# Patient Record
Sex: Female | Born: 1970 | Race: White | Hispanic: No | Marital: Married | Smoking: Never smoker
Health system: Southern US, Community
[De-identification: ages and names within clinical notes are randomized; demographics above are authoritative.]

## PROBLEM LIST (undated history)

## (undated) DIAGNOSIS — N200 Calculus of kidney: Secondary | ICD-10-CM

## (undated) DIAGNOSIS — E039 Hypothyroidism, unspecified: Secondary | ICD-10-CM

## (undated) DIAGNOSIS — K429 Umbilical hernia without obstruction or gangrene: Secondary | ICD-10-CM

## (undated) DIAGNOSIS — Z8639 Personal history of other endocrine, nutritional and metabolic disease: Secondary | ICD-10-CM

## (undated) HISTORY — DX: Calculus of kidney: N20.0

---

## 2004-09-20 ENCOUNTER — Ambulatory Visit: Payer: Self-pay

## 2004-10-08 ENCOUNTER — Ambulatory Visit: Payer: Self-pay | Admitting: Internal Medicine

## 2005-05-02 ENCOUNTER — Other Ambulatory Visit: Admission: RE | Admit: 2005-05-02 | Discharge: 2005-05-02 | Payer: Self-pay | Admitting: Obstetrics and Gynecology

## 2006-02-08 ENCOUNTER — Inpatient Hospital Stay (HOSPITAL_COMMUNITY): Admission: AD | Admit: 2006-02-08 | Discharge: 2006-02-11 | Payer: Self-pay | Admitting: Pediatrics

## 2006-02-28 ENCOUNTER — Emergency Department (HOSPITAL_COMMUNITY): Admission: EM | Admit: 2006-02-28 | Discharge: 2006-02-28 | Payer: Self-pay | Admitting: Family Medicine

## 2006-08-02 ENCOUNTER — Ambulatory Visit: Payer: Self-pay | Admitting: Emergency Medicine

## 2007-02-18 ENCOUNTER — Ambulatory Visit: Payer: Self-pay | Admitting: Internal Medicine

## 2007-08-26 ENCOUNTER — Encounter: Admission: RE | Admit: 2007-08-26 | Discharge: 2007-08-26 | Payer: Self-pay | Admitting: Obstetrics and Gynecology

## 2007-12-03 ENCOUNTER — Ambulatory Visit (HOSPITAL_COMMUNITY): Admission: RE | Admit: 2007-12-03 | Discharge: 2007-12-03 | Payer: Self-pay | Admitting: Obstetrics and Gynecology

## 2008-02-10 HISTORY — PX: NASAL TURBINATE REDUCTION: SHX2072

## 2008-02-11 ENCOUNTER — Encounter (INDEPENDENT_AMBULATORY_CARE_PROVIDER_SITE_OTHER): Payer: Self-pay | Admitting: Otolaryngology

## 2008-02-11 ENCOUNTER — Ambulatory Visit (HOSPITAL_BASED_OUTPATIENT_CLINIC_OR_DEPARTMENT_OTHER): Admission: RE | Admit: 2008-02-11 | Discharge: 2008-02-11 | Payer: Self-pay | Admitting: Otolaryngology

## 2008-02-11 HISTORY — PX: SEPTOPLASTY WITH ETHMOIDECTOMY, AND MAXILLARY ANTROSTOMY: SHX6090

## 2008-03-10 ENCOUNTER — Ambulatory Visit: Payer: Self-pay | Admitting: Family Medicine

## 2008-03-21 ENCOUNTER — Ambulatory Visit (HOSPITAL_COMMUNITY): Admission: RE | Admit: 2008-03-21 | Discharge: 2008-03-21 | Payer: Self-pay | Admitting: Obstetrics and Gynecology

## 2008-03-21 HISTORY — PX: ENDOMETRIAL ABLATION: SHX621

## 2008-03-21 HISTORY — PX: CHROMOPERTUBATION: SHX6288

## 2008-12-06 ENCOUNTER — Inpatient Hospital Stay (HOSPITAL_COMMUNITY): Admission: AD | Admit: 2008-12-06 | Discharge: 2008-12-06 | Payer: Self-pay | Admitting: Obstetrics and Gynecology

## 2008-12-19 ENCOUNTER — Inpatient Hospital Stay (HOSPITAL_COMMUNITY): Admission: RE | Admit: 2008-12-19 | Discharge: 2008-12-21 | Payer: Self-pay | Admitting: Obstetrics and Gynecology

## 2009-01-03 ENCOUNTER — Ambulatory Visit: Admission: RE | Admit: 2009-01-03 | Discharge: 2009-01-03 | Payer: Self-pay | Admitting: Obstetrics and Gynecology

## 2010-04-01 ENCOUNTER — Encounter: Payer: Self-pay | Admitting: Obstetrics and Gynecology

## 2010-06-14 LAB — CBC
Hemoglobin: 9.3 g/dL — ABNORMAL LOW (ref 12.0–15.0)
Platelets: 235 10*3/uL (ref 150–400)
RBC: 3.28 MIL/uL — ABNORMAL LOW (ref 3.87–5.11)
RBC: 3.79 MIL/uL — ABNORMAL LOW (ref 3.87–5.11)
WBC: 12.9 10*3/uL — ABNORMAL HIGH (ref 4.0–10.5)

## 2010-06-14 LAB — RPR: RPR Ser Ql: NONREACTIVE

## 2010-06-25 LAB — PREGNANCY, URINE: Preg Test, Ur: NEGATIVE

## 2010-06-25 LAB — CBC
MCV: 89.3 fL (ref 78.0–100.0)
Platelets: 227 10*3/uL (ref 150–400)
WBC: 9 10*3/uL (ref 4.0–10.5)

## 2010-07-24 NOTE — Op Note (Signed)
NAMEKIRSTEIN, [name]                 ACCOUNT NO.:  [id number]   MEDICAL RECORD NO.:  [id number]          PATIENT TYPE:  AMB   LOCATION:  SDC                           FACILITY:  [facility]   PHYSICIAN:  Dineen Kid. Rana Snare, M.D.    DATE OF BIRTH:  10/21/1970   DATE OF PROCEDURE:  DATE OF DISCHARGE:                               OPERATIVE REPORT   PREOPERATIVE DIAGNOSES:  Pelvic pain and family history of  endometriosis.   POSTOPERATIVE DIAGNOSES:  Pelvic pain and family history of  endometriosis plus mild endometriosis.   PROCEDURE:  Laparoscopy with ablation of endometriosis implants and  chromopertubation.   SURGEON:  Dineen Kid. Rana Snare, MD   ANESTHESIA:  General endotracheal.   INDICATIONS:  Alicia Frazier is a 40 year old, G1, P1, with worsening  problems with pelvic pain.  She is also currently trying to conceive.  She had no problems getting pregnant with her first child.  Does have a  strong family history of endometriosis and wished to proceed for  laparoscopic evaluation of this.  Risks and benefits of the procedure  were discussed at length which include, but not limited to risk of  infection, bleeding, damage to the uterus, tubes, ovary, bowel, bladder,  ureters.  She understands that may not be able to alleviate the pain.  It could recur or could worsen.  She does give her informed consent and  wished to proceed.   FINDINGS AT THE TIME OF SURGERY:  Normal-appearing liver, gallbladder,  appendix, uterus, ovaries, bilateral tubes showed good spill with the  chromopertubation.  She does have several small endometriosis implants  and bilateral uterosacral ligaments and also small 2-3 mm implant at the  base of the cul-de-sac.  Otherwise, normal-appearing ovarian fossa and  ovaries.   DESCRIPTION OF PROCEDURE:  After adequate analgesia, the patient was  placed in the dorsal lithotomy position.  She was sterilely prepped and  draped.  The bladder was sterilely drained.  Graves speculum was  placed.  Tenaculum placed in the edge of the cervix.  A Cohen tenaculum was then  placed.  A 1-cm infraumbilical skin incision was made.  A Veress needle  was inserted.  The abdomen was insufflated with dullness to percussion.  An 11-mm trocar was inserted.  The above findings were noticed.  A 5-mm  trocar was inserted left to the midline 2 fingerbreadths above the pubic  symphysis under direct visualization.  After careful systematic  evaluation of the abdomen and pelvis, the endometriosis implants were  carefully identified.  Bipolar cautery was used to cauterize the  implants at the level of the uterosacral ligaments.  Care taken to avoid  the underlying ureter and achieve good thermal cautery effect at the  peritoneal implants and achieve good hemostasis.  After this was  achieved and no evidence of endometriosis implants were remaining and  good hemostasis had been achieved, the cul-de-sac and the implants at  the base of the cul-de-sac were also ablated in a similar fashion.  Again, careful systematic evaluation of the pelvis, no further implants  were noted.  Chromopertubation was  carried out with good flow noted and  good spill of blue through the fimbriated end.  The abdomen was then  desufflated.  The 5-mm trocar was removed.  The peritoneal hole from the  5-mm site was closed using bipolar cautery, cauterizing the small  peritoneal window.  The 11-mm trocar was then removed, and the abdomen  desufflated.  The 11-mm trocar site was closed with a 0 Vicryl  interrupted suture in the fascia, 3-0 Vicryl Rapide subcuticular suture.  The 5-mm site was closed with a 3-0 Vicryl Rapide subcuticular suture,  and Dermabond.  The incisions were injected with 0.25% Marcaine, total  of 10 mL used.  The tenaculum removed from the cervix and noted to be  hemostatic.  The patient was then transferred to the recovery room in  stable condition.  Sponge and instrument count was normal x3.   Estimated  blood loss was minimal.  The patient received 1 g of cefotetan  preoperatively and 30 mg of Toradol postoperatively.   DISPOSITION:  The patient will be discharged to home and will follow up  in the office in 2-3 weeks.  Given the routine instruction sheet for  laparoscopy.  Told to return for increased pain, fever, or bleeding.  She is sent home with a prescription for Vicodin #30 to take as needed  for pain.  She is also going to continue with Femara at 5 mg days 3  through 7 at timed intercourse.      Dineen Kid Rana Snare, M.D.  Electronically Signed     DCL/MEDQ  D:  03/21/2008  T:  03/21/2008  Job:  [id number]

## 2010-07-24 NOTE — Op Note (Signed)
NAMESHANTALE, HOLTMEYER                 ACCOUNT NO.:  [id number]   MEDICAL RECORD NO.:  [id number]          PATIENT TYPE:  AMB   LOCATION:  DSC                          FACILITY:  [facility]   PHYSICIAN:  Kinnie Scales. Annalee Genta, M.D.DATE OF BIRTH:  Jul 06, 1970   DATE OF PROCEDURE:  02/11/2008  DATE OF DISCHARGE:                               OPERATIVE REPORT   PREOPERATIVE DIAGNOSES:  1. Recurrent sinusitis.  2. Nasal septal deviation.  3. Inferior turbinate hypertrophy.   POSTOPERATIVE DIAGNOSES:  1. Recurrent sinusitis.  2. Nasal septal deviation.  3. Inferior turbinate hypertrophy.   INDICATIONS FOR SURGERY:  1. Recurrent sinusitis.  2. Nasal septal deviation.  3. Inferior turbinate hypertrophy.   SURGICAL PROCEDURE:  1. Bilateral endoscopic sinus surgery consisting of anterior      ethmoidectomy, maxillary antrostomy, nasofrontal recess exploration      and concha bullosa resection.  2. Nasal septoplasty.  3. Bilateral inferior turbinate reduction.   SURGEON:  Kinnie Scales. Annalee Genta, MD   ANESTHESIA:  General endotracheal.   COMPLICATIONS:  None.   BLOOD LOSS:  Less than 150 mL.   The patient transferred from the operating room to the recovery room in  stable condition.   BRIEF HISTORY:  The patient is a 40 year old white female with a history  of recurrent sinusitis and nasal airway obstruction.  The patient has  been treated for multiple episodes of sinusitis requiring antibiotic  therapy with approximately four to five episodes of infection per year.  This has been a progressive situation with worsening infections and  despite appropriate medical therapy, the patient continues to have  chronic nasal airway obstruction, frequent periorbital headache, and  mucopurulent nasal discharge.  CT scanning after antibiotic therapy  showed a severely deviated septum, bilateral turbinate hypertrophy,  bilateral large concha bullosa with obstruction of the ostiomeatal  complex and  mucosal thickening of the [facility] and ethmoid sinuses  bilaterally.  Given the patient's history, examination, and CT findings,  I recommended that we consider her for the above surgical procedures.  The patient had been appropriately treated with medical therapy and  failed to adequately resolve her ongoing symptoms.  Given her history  and examination, she understood and concurred with our plan for surgery.  The risks, benefits, and possible complications were discussed in detail  with the patient and her husband and surgery was scheduled as an  outpatient under general anesthesia at [facility].   PROCEDURE:  The patient was brought to the operating room on February 11, 2008, and placed in supine position on the operating table.  General  endotracheal anesthesia was established without difficulty.  When the  patient was adequately anesthetized, her nose was injected with a total  of 6 mL of 1% lidocaine with 1:100,000 solution of epinephrine injected  in the submucosal fashion along the nasal septum, lateral nasal wall,  middle turbinate, and inferior turbinates bilaterally.  The patient's  nose was then packed with Afrin soaked cottonoid pledgets, which was  left in place for approximately 10 minutes.  After allowing adequate  time for vasoconstriction hemostasis,  the patient was prepped and draped  in a sterile fashion, positioned on the operating table and the surgical  procedure was begun.   Surgery was begun with a left endoscopic sinus surgery.  Using a 0  degree telescope, nasal cavity was examined.  The patient had a very  large middle turbinate concha bullosa.  A vertically oriented incision  was created in the anterior face of the middle turbinate.  This was  carried from anterior to posterior and curved endoscopic scissors were  used to resect the lateral aspect of the concha bullosa preserving the  medial component of the middle turbinate.  The uncinate process was  then  reflected anteriorly and through-cut from inferior to superior.  Anterior ethmoid bulla was identified and using a 0 degree telescope and  a straight microdebrider, it was resected from inferior to superior.  No  further ethmoid dissection was undertaken.  Attention was then turned to  lateral nasal wall with a 45-degree telescope.  The natural ostia of the  maxillary sinus was identified.  It was completely occluded by the  uncinate process and was resected with through-cutting forceps creating  a widely patent maxillary sinus.  Attention was then turned to the  anterior superior ethmoid region, nasofrontal recess was obstructed by  underlying air cells and these were resected with a through-cutting  forceps and a microdebrider to create a widely patent nasofrontal  recess.   Attention was then turned to the nasal septum.  A left anterior  hemitransfixion incision was created and a mucoperichondrial flap was  elevated from the anterior to posterior on the left-hand side.  Bony  cartilaginous junction was crossed at the midline and mucoperiosteal  flap was elevated on the right.  Deviated bone and cartilage in the mid  and posterior aspects of the nasal septum were then resected.  Mid  septal cartilage was morselized and returned to the mucoperichondrial  pocket at the conclusion of the surgical procedure.  The patient had a  large inferior septal spur on the right.  Overlying mucosa was elevated  and the spur was removed using a 4-mm osteotome.  The nasal cavity was  cleared of debris and the nasal septum was brought to the midline.  Mid  septal cartilage was replaced and the mucosal flaps were reapproximated  with a 4-0 gut suture on a Keith needle in horizontal mattress fashion.  Bilateral Doyle nasal septal splints were placed after the application  of Bactroban ointment and sutured in position with 3-0 Ethilon suture.   Attention was then turned to the right-hand side where 0  degree  endoscopy was performed.  Again, a large right middle turbinate concha  bullosa was resected by creating a vertical incision and removing the  lateral portion of the concha.  The uncinate process was then resected  with a back cutting forceps and the natural ostia in the maxillary sinus  was enlarged in an anterior, inferior, and posterior direction.  Ethmoid  bulla was resected with a straight microdebrider and a 0 degree  telescope from inferior to superior entering the posterior ethmoid  cells.  Thickened mucosa was resected and bony septations were taken  down to create a widely patent anterior ethmoid cavity.  With a 45-  degree telescope, the nasofrontal recess was identified and underlying  disease was removed creating a widely patent nasofrontal recess on the  right.   Inferior turbinate reduction was then performed with cautery set at 12  watts.  Two  submucosal passes were made in each inferior turbinate.  When the turbinates had been adequately cauterized, they were incised  anteriorly and a small amount of turbinate bone was resected preserving  the overlying mucosa.  The turbinates were then outfractured creating  more patent nasal cavity.   The patient's nasal cavity and nasopharynx were irrigated and suctioned.  The sinus cavities were inspected bilaterally and surgical debris was  cleared.  Sinuses were irrigated.  Minimal bleeding.  Bilateral Kennedy  sinus packs were placed in the anterior ethmoid region bilaterally and  hydrated with sterile saline.  The nasal septal splints were secured in  position with a 3-0 Ethilon suture.  The patient was awakened from  anesthetic.  Orogastric tube was passed.  Stomach contents aspirated.  She was then extubated and transferred from the operating room to  recovery room in stable condition.  No complications and blood loss was  approximately 150 mL.           ______________________________  Kinnie Scales. Annalee Genta,  M.D.     DLS/MEDQ  D:  04/54/0981  T:  02/12/2008  Job:  [id number]

## 2010-08-03 ENCOUNTER — Inpatient Hospital Stay (HOSPITAL_COMMUNITY)
Admission: RE | Admit: 2010-08-03 | Discharge: 2010-08-03 | Disposition: A | Discharge status: Home / Self Care | Payer: 59 | Source: Ambulatory Visit | Attending: Family Medicine | Admitting: Family Medicine

## 2010-12-14 LAB — POCT HEMOGLOBIN-HEMACUE: Hemoglobin: 14.4 g/dL (ref 12.0–15.0)

## 2011-06-17 ENCOUNTER — Encounter: Payer: Self-pay | Admitting: Family Medicine

## 2011-06-17 ENCOUNTER — Ambulatory Visit (INDEPENDENT_AMBULATORY_CARE_PROVIDER_SITE_OTHER): Payer: 59 | Admitting: Family Medicine

## 2011-06-17 VITALS — BP 128/84 | HR 87 | Ht 66.0 in | Wt 158.0 lb

## 2011-06-17 DIAGNOSIS — M259 Joint disorder, unspecified: Secondary | ICD-10-CM

## 2011-06-17 DIAGNOSIS — M25561 Pain in right knee: Secondary | ICD-10-CM

## 2011-06-17 DIAGNOSIS — M2290 Unspecified disorder of patella, unspecified knee: Secondary | ICD-10-CM

## 2011-06-17 DIAGNOSIS — M25569 Pain in unspecified knee: Secondary | ICD-10-CM

## 2011-06-18 ENCOUNTER — Encounter: Payer: Self-pay | Admitting: Family Medicine

## 2011-06-18 DIAGNOSIS — J45909 Unspecified asthma, uncomplicated: Secondary | ICD-10-CM | POA: Insufficient documentation

## 2011-06-18 DIAGNOSIS — E0500 Thyrotoxicosis with diffuse goiter without thyrotoxic crisis or storm: Secondary | ICD-10-CM | POA: Insufficient documentation

## 2011-06-18 DIAGNOSIS — M2290 Unspecified disorder of patella, unspecified knee: Secondary | ICD-10-CM | POA: Insufficient documentation

## 2011-06-18 DIAGNOSIS — M25561 Pain in right knee: Secondary | ICD-10-CM | POA: Insufficient documentation

## 2011-06-18 NOTE — Progress Notes (Signed)
Subjective:    Patient ID: Alicia Frazier, female    DOB: [birth year], 41 y.o.   MRN: [id number]  HPI  Right knee pain since her last half marathon on May 26, 2011. She had no pain during the race. After the race she was walking around and noticed the beginning of some anterior knee pain underneath the patella. This has essentially continued on a daily basis worse with running and after running since then. She's taken a few days off from running and try to cross train with spin class. That has not seemed to help. The knee has not swollen. She's noted no redness or warmth of the joint. She's had no distal numbness, no pain in her calf. She is training for a full marathon and a little frustrated that she can't get back to her regular schedule. She usually runs 3-4 miles several times a week and then does a lot of aerobic cross training.  PERTINENT  PMH / PSH: No prior history of knee surgery for specific right knee injury. She has had an episode of knee bursitis in the past that was resolved with injection.  Review of Systems Has been running now for 2-3 years. Started running for weight loss. Has lost some weight over that time period but no unusual weight gain or loss. Denies fever, sweats, chills. No other joint pains.    Objective:   Physical Exam  Vital signs reviewed. GENERAL: Well developed, well nourished, no acute distress KNEES: Patella bilaterally are lateralized. She is tender to palpation underneath the medial top portion of the patella right on the medial femoral condyle. Compression of the patella tear increases pain. Otherwise there is no patellar grind. There is no crepitus. She has full extension and flexion. Immensely intact with negative McMurray. The calf is soft. Quadriceps bilaterally are symmetrical and have normal bulk and tone. HIPS: Intact 5 out of 5 strength in gluteus medius testing, abductor, abduction of the hips. Leg length is 1/2 cm shorter on the left than the  right.  ULTRASOUND:  Patella reveals small amount of osteophytes noted at the inferior and lateral borders. There is a small amount of effusion noted in the suprapatellar pouch. There is a small fluid collection underneath the patellar tendon. The lateral meniscus looks normal. The medial meniscus is likely normal although is slightly truncated in the proximal portion, I suspect this is related to mild degeneration rather than overt tear. Quadriceps muscles appear normal. The patellar and quadricep tendon are intact and appear normal.  GAIT: She is a mid foot strike her. Fairly upright upper body posture during run. Slight crossover of the right knee across the midline. Slight lateral position of the toe on the swing phase.      Assessment & Plan:  #1. Knee pain. I think this is related to biomechanical issues. I suspect her gait changes a little bit as she fatigues with the longer distances. She is very tender right now so I would like to get this area some rest with a patellar buttress in May neoprene knee sleeve. I doubt she'll be able to run in that. We'll also start her on VMO exercises and hip abduction. For the abduction out like her to use the machine at the gym and really push the weight and the risks. I will not have her do any adduction exercises we discussed icing. She's not improving over the next 2-3 weeks with this regimen she'll return to clinic. She seems happy with this plan.

## 2011-06-20 ENCOUNTER — Ambulatory Visit: Payer: 59 | Admitting: Sports Medicine

## 2011-12-13 ENCOUNTER — Other Ambulatory Visit (HOSPITAL_COMMUNITY): Payer: Self-pay | Admitting: Sports Medicine

## 2011-12-13 DIAGNOSIS — M79606 Pain in leg, unspecified: Secondary | ICD-10-CM

## 2011-12-14 ENCOUNTER — Ambulatory Visit (HOSPITAL_COMMUNITY)
Admission: RE | Admit: 2011-12-14 | Discharge: 2011-12-14 | Disposition: A | Discharge status: Home / Self Care | Payer: 59 | Source: Ambulatory Visit | Attending: Sports Medicine | Admitting: Sports Medicine

## 2011-12-14 DIAGNOSIS — M79609 Pain in unspecified limb: Secondary | ICD-10-CM | POA: Insufficient documentation

## 2011-12-14 DIAGNOSIS — R5383 Other fatigue: Secondary | ICD-10-CM | POA: Insufficient documentation

## 2011-12-14 DIAGNOSIS — M79606 Pain in leg, unspecified: Secondary | ICD-10-CM

## 2011-12-14 DIAGNOSIS — R5381 Other malaise: Secondary | ICD-10-CM | POA: Insufficient documentation

## 2013-02-15 ENCOUNTER — Encounter (INDEPENDENT_AMBULATORY_CARE_PROVIDER_SITE_OTHER): Payer: Self-pay | Admitting: General Surgery

## 2013-02-15 ENCOUNTER — Ambulatory Visit (INDEPENDENT_AMBULATORY_CARE_PROVIDER_SITE_OTHER): Payer: Commercial Managed Care - PPO | Admitting: General Surgery

## 2013-02-15 VITALS — BP 134/86 | HR 71 | Temp 99.1°F | Resp 16 | Ht 66.0 in | Wt 170.6 lb

## 2013-02-15 DIAGNOSIS — K432 Incisional hernia without obstruction or gangrene: Secondary | ICD-10-CM

## 2013-02-15 NOTE — Progress Notes (Signed)
Patient ID: Alicia Frazier, female   DOB: October 01, 1970, 42 y.o.   MRN: [id number]  Chief Complaint  Patient presents with  . Umbilical Hernia    HPI Alicia Frazier is a 42 y.o. female.  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  She has history since last child of pain and a bulge that she reduces at her umbilicus. She has undergone a diagnostic laparoscopy via umbilicus for endometriosis making this an incisional hernia.  She reports no other changes.  She does hot yoga and long distance running.  This area causes her pain when doing these activities.  She is always able to reduce this.  It has been worsening and she comes in to discuss.  History reviewed. No pertinent past medical history.  History reviewed. No pertinent past surgical history.  No family history on file.  Social History History  Substance Use Topics  . Smoking status: Never Smoker   . Smokeless tobacco: Never Used  . Alcohol Use: Not on file    Allergies  Allergen Reactions  . Compazine Other (See Comments)    hallucination  . Sulfa Antibiotics Rash    Current Outpatient Prescriptions  Medication Sig Dispense Refill  . levonorgestrel (MIRENA) 20 MCG/24HR IUD 1 each by Intrauterine route once.      Marland Kitchen levothyroxine (SYNTHROID, LEVOTHROID) 200 MCG tablet Take 200 mcg by mouth daily.      . montelukast (SINGULAIR) 10 MG tablet Take 10 mg by mouth at bedtime.       No current facility-administered medications for this visit.    Review of Systems Review of Systems  Constitutional: Negative for fever, chills and unexpected weight change.  HENT: Negative for congestion, hearing loss, sore throat, trouble swallowing and voice change.   Eyes: Negative for visual disturbance.  Respiratory: Negative for cough and wheezing.   Cardiovascular: Negative for chest pain, palpitations and leg swelling.   Gastrointestinal: Negative for nausea, vomiting, abdominal pain, diarrhea, constipation, blood in stool, abdominal distention and anal bleeding.  Genitourinary: Negative for hematuria, vaginal bleeding and difficulty urinating.  Musculoskeletal: Negative for arthralgias.  Skin: Negative for rash and wound.  Neurological: Negative for seizures, syncope and headaches.  Hematological: Negative for adenopathy. Does not bruise/bleed easily.  Psychiatric/Behavioral: Negative for confusion.    Blood pressure 134/86, pulse 71, temperature 99.1 F (37.3 C), temperature source Temporal, resp. rate 16, height 5\' 6"  (1.676 m), weight 170 lb 9.6 oz (77.384 kg).  Physical Exam Physical Exam  Vitals reviewed. Constitutional: She appears well-developed and well-nourished.  Abdominal: Soft. Normal appearance and bowel sounds are normal. She exhibits no distension. There is no tenderness. A hernia (small incisional hernia at umbilicus that is reducible) is present. Hernia confirmed positive in the ventral area.      Assessment    Incisional hernia     Plan    We discussed all the options for repair.  I did recommend repair due to symptoms she is having.  She has marathon coming up in January and I think fixing after that as long as it doesn't get worse is a good idea. I would fix this with mesh given its nature as incisional hernia.  She does not plan on any more children.  We discussed a laparoscopic vs open repair.  I think that an open repair similar to an umbilical hernia repair with  mesh is best option for her.  I will leave laparoscope as option if she has any adhesions or this is difficult also.  We went over both repairs and I think for repair, cosmesis and back to activity level this would be best option. There is risk of bleeding, infection, recurrence, injury requiring repair.  She is going to let me know when she wants to have this repaired.  I will see her back prior to the surgery also.         Alicia Frazier 02/15/2013, 2:16 PM

## 2013-03-13 ENCOUNTER — Other Ambulatory Visit (INDEPENDENT_AMBULATORY_CARE_PROVIDER_SITE_OTHER): Payer: Self-pay | Admitting: General Surgery

## 2013-03-19 ENCOUNTER — Telehealth (INDEPENDENT_AMBULATORY_CARE_PROVIDER_SITE_OTHER): Payer: Self-pay

## 2013-03-19 NOTE — Telephone Encounter (Signed)
LMOM giving pt an appt to see Dr Donne Hazel again before her surgery on 2/12 per his request. I gave her the appt for 1/29 arrive at 4:10/4:20.

## 2013-03-31 ENCOUNTER — Other Ambulatory Visit: Payer: Self-pay | Admitting: Dermatology

## 2013-04-08 ENCOUNTER — Encounter (INDEPENDENT_AMBULATORY_CARE_PROVIDER_SITE_OTHER): Payer: Self-pay

## 2013-04-08 ENCOUNTER — Ambulatory Visit (INDEPENDENT_AMBULATORY_CARE_PROVIDER_SITE_OTHER): Payer: Commercial Managed Care - PPO | Admitting: General Surgery

## 2013-04-08 ENCOUNTER — Encounter (INDEPENDENT_AMBULATORY_CARE_PROVIDER_SITE_OTHER): Payer: Self-pay | Admitting: General Surgery

## 2013-04-08 VITALS — BP 110/74 | HR 70 | Resp 16 | Ht 66.0 in | Wt 173.0 lb

## 2013-04-08 DIAGNOSIS — K432 Incisional hernia without obstruction or gangrene: Secondary | ICD-10-CM

## 2013-04-10 NOTE — Progress Notes (Signed)
Patient ID: Alicia Frazier, female   DOB: 30-Mar-1970, 43 y.o.   MRN: [id number]  Chief Complaint  Patient presents with  . Follow-up    Recheck incisional umb hernia    HPI Alicia Frazier is a 43 y.o. female.   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 She has history since last child of pain and a bulge that she reduces at her umbilicus. She has undergone a diagnostic laparoscopy via umbilicus for endometriosis making this an incisional hernia. She reports no other changes since our last visit and has been running a lot. She does hot yoga and long distance running. This area causes her pain when doing these activities esp after long distances.   History reviewed. No pertinent past medical history.  History reviewed. No pertinent past surgical history.  History reviewed. No pertinent family history.  Social History History  Substance Use Topics  . Smoking status: Never Smoker   . Smokeless tobacco: Never Used  . Alcohol Use: Not on file    Allergies  Allergen Reactions  . Compazine Other (See Comments)    hallucination  . Sulfa Antibiotics Rash    Current Outpatient Prescriptions  Medication Sig Dispense Refill  . levonorgestrel (MIRENA) 20 MCG/24HR IUD 1 each by Intrauterine route once.      Marland Kitchen levothyroxine (SYNTHROID, LEVOTHROID) 200 MCG tablet Take 200 mcg by mouth daily.      . montelukast (SINGULAIR) 10 MG tablet Take 10 mg by mouth at bedtime.       No current facility-administered medications for this visit.    Review of Systems Review of Systems  Constitutional: Negative for fever, chills and unexpected weight change.  HENT: Negative for congestion, hearing loss, sore throat, trouble swallowing and voice change.   Eyes: Negative for visual disturbance.  Respiratory: Negative for cough and wheezing.   Cardiovascular: Negative for chest pain, palpitations and leg  swelling.  Gastrointestinal: Negative for nausea, vomiting, abdominal pain, diarrhea, constipation, blood in stool, abdominal distention and anal bleeding.  Genitourinary: Negative for hematuria, vaginal bleeding and difficulty urinating.  Musculoskeletal: Negative for arthralgias.  Skin: Negative for rash and wound.  Neurological: Negative for seizures, syncope and headaches.  Hematological: Negative for adenopathy. Does not bruise/bleed easily.  Psychiatric/Behavioral: Negative for confusion.    Blood pressure 110/74, pulse 70, resp. rate 16, height 5' 6"  (1.676 m), weight 173 lb (78.472 kg).  Physical Exam Physical Exam  Vitals reviewed. Constitutional: She appears well-developed and well-nourished.  Cardiovascular: Normal rate and regular rhythm.   Pulmonary/Chest: Effort normal.  Abdominal: She exhibits no distension. There is no tenderness. A hernia is present. Hernia confirmed positive in the ventral area.       Assessment    Incisional hernia    Plan    We discussed all the options for repair. I would fix this with mesh given its nature as incisional hernia.  We discussed a laparoscopic vs open repair. I think that an open repair similar to an umbilical hernia repair with mesh is best option for her. I will leave laparoscope as option if she has any adhesions or this is difficult also. I think evaluating with laparoscopy first and then deciding on repair is reasonable.  We went over both repairs and I think for repair, cosmesis and back to activity level this would be best option. There is risk of bleeding,  infection, recurrence, injury requiring repair. She is going to let me know when she wants to have this repaired. I will see her back prior to the surgery also.         Solmon Bohr 04/10/2013, 2:14 PM

## 2013-04-11 DIAGNOSIS — K429 Umbilical hernia without obstruction or gangrene: Secondary | ICD-10-CM

## 2013-04-11 HISTORY — DX: Umbilical hernia without obstruction or gangrene: K42.9

## 2013-04-15 ENCOUNTER — Encounter (HOSPITAL_BASED_OUTPATIENT_CLINIC_OR_DEPARTMENT_OTHER): Payer: Self-pay | Admitting: *Deleted

## 2013-04-15 NOTE — Pre-Procedure Instructions (Signed)
To come fore BMET, CBC, diff

## 2013-04-16 ENCOUNTER — Encounter (HOSPITAL_BASED_OUTPATIENT_CLINIC_OR_DEPARTMENT_OTHER)
Admission: RE | Admit: 2013-04-16 | Discharge: 2013-04-16 | Disposition: A | Discharge status: Home / Self Care | Payer: 59 | Source: Ambulatory Visit | Attending: General Surgery | Admitting: General Surgery

## 2013-04-16 DIAGNOSIS — Z01812 Encounter for preprocedural laboratory examination: Secondary | ICD-10-CM | POA: Insufficient documentation

## 2013-04-16 DIAGNOSIS — Z01818 Encounter for other preprocedural examination: Secondary | ICD-10-CM | POA: Insufficient documentation

## 2013-04-16 LAB — CBC WITH DIFFERENTIAL/PLATELET
BASOS PCT: 0 % (ref 0–1)
Basophils Absolute: 0 10*3/uL (ref 0.0–0.1)
EOS ABS: 0.3 10*3/uL (ref 0.0–0.7)
EOS PCT: 3 % (ref 0–5)
HCT: 44 % (ref 36.0–46.0)
HEMOGLOBIN: 15.2 g/dL — AB (ref 12.0–15.0)
LYMPHS ABS: 1.8 10*3/uL (ref 0.7–4.0)
Lymphocytes Relative: 24 % (ref 12–46)
MCH: 31.6 pg (ref 26.0–34.0)
MCHC: 34.5 g/dL (ref 30.0–36.0)
MCV: 91.5 fL (ref 78.0–100.0)
MONOS PCT: 9 % (ref 3–12)
Monocytes Absolute: 0.7 10*3/uL (ref 0.1–1.0)
NEUTROS PCT: 63 % (ref 43–77)
Neutro Abs: 4.8 10*3/uL (ref 1.7–7.7)
Platelets: 204 10*3/uL (ref 150–400)
RBC: 4.81 MIL/uL (ref 3.87–5.11)
RDW: 12.9 % (ref 11.5–15.5)
WBC: 7.6 10*3/uL (ref 4.0–10.5)

## 2013-04-16 LAB — BASIC METABOLIC PANEL
BUN: 18 mg/dL (ref 6–23)
CALCIUM: 9.2 mg/dL (ref 8.4–10.5)
CO2: 25 meq/L (ref 19–32)
Chloride: 103 mEq/L (ref 96–112)
Creatinine, Ser: 0.68 mg/dL (ref 0.50–1.10)
GFR calc Af Amer: >90 mL/min (ref >90)
GLUCOSE: 83 mg/dL (ref 70–99)
POTASSIUM: 4.1 meq/L (ref 3.7–5.3)
SODIUM: 140 meq/L (ref 137–147)

## 2013-04-22 ENCOUNTER — Ambulatory Visit (HOSPITAL_COMMUNITY): Payer: 59

## 2013-04-22 ENCOUNTER — Ambulatory Visit (HOSPITAL_BASED_OUTPATIENT_CLINIC_OR_DEPARTMENT_OTHER)
Admission: RE | Admit: 2013-04-22 | Discharge: 2013-04-22 | Disposition: A | Discharge status: Home / Self Care | Payer: 59 | Source: Ambulatory Visit | Attending: General Surgery | Admitting: General Surgery

## 2013-04-22 ENCOUNTER — Encounter (HOSPITAL_BASED_OUTPATIENT_CLINIC_OR_DEPARTMENT_OTHER): Payer: 59 | Admitting: Anesthesiology

## 2013-04-22 ENCOUNTER — Ambulatory Visit (HOSPITAL_BASED_OUTPATIENT_CLINIC_OR_DEPARTMENT_OTHER): Payer: 59 | Admitting: Anesthesiology

## 2013-04-22 ENCOUNTER — Encounter (HOSPITAL_BASED_OUTPATIENT_CLINIC_OR_DEPARTMENT_OTHER): Payer: Self-pay | Admitting: Anesthesiology

## 2013-04-22 ENCOUNTER — Encounter (HOSPITAL_BASED_OUTPATIENT_CLINIC_OR_DEPARTMENT_OTHER)
Admission: RE | Disposition: A | Discharge status: Home / Self Care | Payer: Self-pay | Source: Ambulatory Visit | Attending: General Surgery

## 2013-04-22 DIAGNOSIS — J45909 Unspecified asthma, uncomplicated: Secondary | ICD-10-CM | POA: Insufficient documentation

## 2013-04-22 DIAGNOSIS — E039 Hypothyroidism, unspecified: Secondary | ICD-10-CM | POA: Insufficient documentation

## 2013-04-22 DIAGNOSIS — K432 Incisional hernia without obstruction or gangrene: Secondary | ICD-10-CM

## 2013-04-22 HISTORY — PX: INSERTION OF MESH: SHX5868

## 2013-04-22 HISTORY — DX: Hypothyroidism, unspecified: E03.9

## 2013-04-22 HISTORY — PX: UMBILICAL HERNIA REPAIR: SHX196

## 2013-04-22 HISTORY — DX: Umbilical hernia without obstruction or gangrene: K42.9

## 2013-04-22 HISTORY — DX: Personal history of other endocrine, nutritional and metabolic disease: Z86.39

## 2013-04-22 SURGERY — REPAIR, HERNIA, UMBILICAL, LAPAROSCOPIC
Anesthesia: General

## 2013-04-22 MED ORDER — OXYCODONE HCL 5 MG PO TABS
ORAL_TABLET | ORAL | Status: AC
  Filled 2013-04-22: qty 1

## 2013-04-22 MED ORDER — MIDAZOLAM HCL 5 MG/5ML IJ SOLN
INTRAMUSCULAR | Status: DC | PRN
  Administered 2013-04-22: 2 mg via INTRAVENOUS

## 2013-04-22 MED ORDER — OXYCODONE HCL 5 MG/5ML PO SOLN: 5.0000 mg | Freq: Once | ORAL | Status: AC | PRN

## 2013-04-22 MED ORDER — KETOROLAC TROMETHAMINE 30 MG/ML IJ SOLN
INTRAMUSCULAR | Status: DC | PRN
  Administered 2013-04-22: 30 mg via INTRAVENOUS

## 2013-04-22 MED ORDER — DEXAMETHASONE SODIUM PHOSPHATE 4 MG/ML IJ SOLN
INTRAMUSCULAR | Status: DC | PRN
  Administered 2013-04-22: 10 mg via INTRAVENOUS

## 2013-04-22 MED ORDER — MIDAZOLAM HCL 2 MG/2ML IJ SOLN
INTRAMUSCULAR | Status: AC
  Filled 2013-04-22: qty 2

## 2013-04-22 MED ORDER — CEFAZOLIN SODIUM-DEXTROSE 2-3 GM-% IV SOLR
INTRAVENOUS | Status: AC
  Filled 2013-04-22: qty 50

## 2013-04-22 MED ORDER — LACTATED RINGERS IV SOLN
INTRAVENOUS | Status: DC
  Administered 2013-04-22: 12:00:00 via INTRAVENOUS

## 2013-04-22 MED ORDER — LIDOCAINE HCL (CARDIAC) 20 MG/ML IV SOLN
INTRAVENOUS | Status: DC | PRN
  Administered 2013-04-22: 50 mg via INTRAVENOUS

## 2013-04-22 MED ORDER — MIDAZOLAM HCL 2 MG/ML PO SYRP: 12.0000 mg | ORAL_SOLUTION | Freq: Once | ORAL | Status: DC | PRN

## 2013-04-22 MED ORDER — FENTANYL CITRATE 0.05 MG/ML IJ SOLN
INTRAMUSCULAR | Status: DC | PRN
  Administered 2013-04-22: 100 ug via INTRAVENOUS
  Administered 2013-04-22 (×2): 25 ug via INTRAVENOUS

## 2013-04-22 MED ORDER — CEFAZOLIN SODIUM-DEXTROSE 2-3 GM-% IV SOLR
2.0000 g | INTRAVENOUS | Status: AC
  Administered 2013-04-22: 2 g via INTRAVENOUS

## 2013-04-22 MED ORDER — SUCCINYLCHOLINE CHLORIDE 20 MG/ML IJ SOLN
INTRAMUSCULAR | Status: DC | PRN
  Administered 2013-04-22: 80 mg via INTRAVENOUS

## 2013-04-22 MED ORDER — HYDROMORPHONE HCL PF 1 MG/ML IJ SOLN
INTRAMUSCULAR | Status: AC
  Filled 2013-04-22: qty 1

## 2013-04-22 MED ORDER — OXYCODONE-ACETAMINOPHEN 10-325 MG PO TABS: 1.0000 | ORAL_TABLET | Freq: Four times a day (QID) | ORAL | Status: DC | PRN

## 2013-04-22 MED ORDER — BUPIVACAINE HCL (PF) 0.25 % IJ SOLN
INTRAMUSCULAR | Status: DC | PRN
  Administered 2013-04-22: 6 mL

## 2013-04-22 MED ORDER — PROPOFOL 10 MG/ML IV BOLUS
INTRAVENOUS | Status: DC | PRN
  Administered 2013-04-22: 200 mg via INTRAVENOUS

## 2013-04-22 MED ORDER — FENTANYL CITRATE 0.05 MG/ML IJ SOLN: 50.0000 ug | INTRAMUSCULAR | Status: DC | PRN

## 2013-04-22 MED ORDER — HYDROMORPHONE HCL PF 1 MG/ML IJ SOLN
0.2500 mg | INTRAMUSCULAR | Status: DC | PRN
  Administered 2013-04-22 (×3): 0.5 mg via INTRAVENOUS

## 2013-04-22 MED ORDER — ONDANSETRON HCL 4 MG/2ML IJ SOLN
INTRAMUSCULAR | Status: DC | PRN
  Administered 2013-04-22: 4 mg via INTRAVENOUS

## 2013-04-22 MED ORDER — LIDOCAINE HCL (PF) 1 % IJ SOLN
INTRAMUSCULAR | Status: AC
  Filled 2013-04-22: qty 30

## 2013-04-22 MED ORDER — FENTANYL CITRATE 0.05 MG/ML IJ SOLN
INTRAMUSCULAR | Status: AC
  Filled 2013-04-22: qty 6

## 2013-04-22 MED ORDER — BUPIVACAINE HCL (PF) 0.25 % IJ SOLN
INTRAMUSCULAR | Status: AC
  Filled 2013-04-22: qty 30

## 2013-04-22 MED ORDER — OXYCODONE HCL 5 MG PO TABS
5.0000 mg | ORAL_TABLET | Freq: Once | ORAL | Status: AC | PRN
  Administered 2013-04-22: 5 mg via ORAL

## 2013-04-22 MED ORDER — MIDAZOLAM HCL 2 MG/2ML IJ SOLN: 1.0000 mg | INTRAMUSCULAR | Status: DC | PRN

## 2013-04-22 SURGICAL SUPPLY — 75 items
ADH SKN CLS APL DERMABOND .7 (GAUZE/BANDAGES/DRESSINGS) ×1
APL SKNCLS STERI-STRIP NONHPOA (GAUZE/BANDAGES/DRESSINGS)
APPLIER CLIP 5 13 M/L LIGAMAX5 (MISCELLANEOUS)
APPLIER CLIP ROT 10 11.4 M/L (STAPLE)
APR CLP MED LRG 11.4X10 (STAPLE)
APR CLP MED LRG 5 ANG JAW (MISCELLANEOUS)
BENZOIN TINCTURE PRP APPL 2/3 (GAUZE/BANDAGES/DRESSINGS) ×1 IMPLANT
BLADE SURG 15 STRL LF DISP TIS (BLADE) ×1 IMPLANT
BLADE SURG 15 STRL SS (BLADE) ×3
BLADE SURG ROTATE 9660 (MISCELLANEOUS) IMPLANT
CATH FOLEY 2WAY SLVR  5CC 14FR (CATHETERS)
CATH FOLEY 2WAY SLVR 5CC 14FR (CATHETERS) ×1 IMPLANT
CHLORAPREP W/TINT 26ML (MISCELLANEOUS) ×3 IMPLANT
CLIP APPLIE 5 13 M/L LIGAMAX5 (MISCELLANEOUS) IMPLANT
CLIP APPLIE ROT 10 11.4 M/L (STAPLE) IMPLANT
CLOSURE WOUND 1/2 X4 (GAUZE/BANDAGES/DRESSINGS)
COVER MAYO STAND STRL (DRAPES) IMPLANT
COVER TABLE BACK 60X90 (DRAPES) IMPLANT
DECANTER SPIKE VIAL GLASS SM (MISCELLANEOUS) ×1 IMPLANT
DERMABOND ADVANCED (GAUZE/BANDAGES/DRESSINGS) ×2
DERMABOND ADVANCED .7 DNX12 (GAUZE/BANDAGES/DRESSINGS) ×1 IMPLANT
DEVICE SECURE STRAP 25 ABSORB (INSTRUMENTS) ×3 IMPLANT
DEVICE TROCAR PUNCTURE CLOSURE (ENDOMECHANICALS) IMPLANT
DRAPE INCISE IOBAN 66X45 STRL (DRAPES) ×3 IMPLANT
DRAPE PED LAPAROTOMY (DRAPES) IMPLANT
DRSG TEGADERM 2-3/8X2-3/4 SM (GAUZE/BANDAGES/DRESSINGS) IMPLANT
DRSG TEGADERM 4X4.75 (GAUZE/BANDAGES/DRESSINGS) IMPLANT
ELECT COATED BLADE 2.86 ST (ELECTRODE) ×2 IMPLANT
ELECT REM PT RETURN 9FT ADLT (ELECTROSURGICAL) ×3
ELECTRODE REM PT RTRN 9FT ADLT (ELECTROSURGICAL) ×1 IMPLANT
GLOVE BIO SURGEON STRL SZ7 (GLOVE) ×3 IMPLANT
GLOVE BIOGEL PI IND STRL 7.0 (GLOVE) IMPLANT
GLOVE BIOGEL PI IND STRL 7.5 (GLOVE) ×1 IMPLANT
GLOVE BIOGEL PI INDICATOR 7.0 (GLOVE) ×4
GLOVE BIOGEL PI INDICATOR 7.5 (GLOVE) ×2
GLOVE ECLIPSE 6.5 STRL STRAW (GLOVE) ×4 IMPLANT
GLOVE EXAM NITRILE PF MED BLUE (GLOVE) ×2 IMPLANT
GOWN STRL REUS W/ TWL LRG LVL3 (GOWN DISPOSABLE) ×3 IMPLANT
GOWN STRL REUS W/TWL LRG LVL3 (GOWN DISPOSABLE) ×9
MARKER SKIN DUAL TIP RULER LAB (MISCELLANEOUS) IMPLANT
MESH VENTRALEX ST 2.5 CRC MED (Mesh General) ×2 IMPLANT
NDL HYPO 25X1 1.5 SAFETY (NEEDLE) IMPLANT
NDL SPNL 22GX3.5 QUINCKE BK (NEEDLE) IMPLANT
NEEDLE HYPO 22GX1.5 SAFETY (NEEDLE) IMPLANT
NEEDLE HYPO 25X1 1.5 SAFETY (NEEDLE) IMPLANT
NEEDLE SPNL 22GX3.5 QUINCKE BK (NEEDLE) IMPLANT
NS IRRIG 1000ML POUR BTL (IV SOLUTION) IMPLANT
PACK BASIN DAY SURGERY FS (CUSTOM PROCEDURE TRAY) ×3 IMPLANT
PENCIL BUTTON HOLSTER BLD 10FT (ELECTRODE) ×2 IMPLANT
SCISSORS LAP 5X35 DISP (ENDOMECHANICALS) IMPLANT
SET IRRIG TUBING LAPAROSCOPIC (IRRIGATION / IRRIGATOR) IMPLANT
SLEEVE ENDOPATH XCEL 5M (ENDOMECHANICALS) ×2 IMPLANT
SLEEVE SCD COMPRESS KNEE MED (MISCELLANEOUS) ×3 IMPLANT
SPONGE GAUZE 4X4 12PLY STER LF (GAUZE/BANDAGES/DRESSINGS) IMPLANT
SPONGE LAP 4X18 X RAY DECT (DISPOSABLE) IMPLANT
STRIP CLOSURE SKIN 1/2X4 (GAUZE/BANDAGES/DRESSINGS) IMPLANT
SUT ETHIBOND 0 MO6 C/R (SUTURE) ×2 IMPLANT
SUT MNCRL AB 3-0 PS2 18 (SUTURE) IMPLANT
SUT MNCRL AB 4-0 PS2 18 (SUTURE) ×3 IMPLANT
SUT NOVA NAB DX-16 0-1 5-0 T12 (SUTURE) ×3 IMPLANT
SUT VIC AB 0 SH 27 (SUTURE) IMPLANT
SUT VIC AB 2-0 SH 27 (SUTURE)
SUT VIC AB 2-0 SH 27XBRD (SUTURE) IMPLANT
SUT VIC AB 3-0 FS2 27 (SUTURE) IMPLANT
SUT VIC AB 3-0 SH 27 (SUTURE) ×3
SUT VIC AB 3-0 SH 27X BRD (SUTURE) ×1 IMPLANT
SUT VICRYL AB 3 0 TIES (SUTURE) IMPLANT
SYR CONTROL 10ML LL (SYRINGE) IMPLANT
TOWEL OR 17X24 6PK STRL BLUE (TOWEL DISPOSABLE) ×4 IMPLANT
TOWEL OR NON WOVEN STRL DISP B (DISPOSABLE) ×3 IMPLANT
TRAY DSU PREP LF (CUSTOM PROCEDURE TRAY) IMPLANT
TRAY LAPAROSCOPIC (CUSTOM PROCEDURE TRAY) ×3 IMPLANT
TROCAR XCEL BLUNT TIP 100MML (ENDOMECHANICALS) ×1 IMPLANT
TROCAR XCEL NON-BLD 11X100MML (ENDOMECHANICALS) ×1 IMPLANT
TROCAR XCEL NON-BLD 5MMX100MML (ENDOMECHANICALS) ×3 IMPLANT

## 2013-04-22 NOTE — Op Note (Signed)
Preoperative diagnosis: incisional hernia Postoperative diagnosis: same as above Procedure: 1. Diagnostic laparoscopy 2. Open incisional hernia repair with 6.4 cm bard ventralight umbilical patch Surgeon: Dr Serita Grammes Anesthesia: general EBL: minimal Specimens: none Drains: none Dispo: to recovery stable Sponge count correct, instruments not counted but film confirmed none present  Indications: This is a 73 yof who has history of diagnostic laparoscopy and now has small incisional hernia.  This has become more symptomatic. This area feels very small on her exam and we discussed the diagnostic laparoscopy and then repair depending on house large it was.  Procedure: After informed consent was obtained the patient was taken to the operating room. She was given cefazolin. Sequential compression devices were on her legs. She was then placed under general anesthesia without complication. Her abdomen was prepped and draped in the standard sterile surgical fashion. A surgical timeout was then performed.  Her stomach was evacuated with an orogastric tube. I then infiltrated Marcaine a left upper quadrant. I then accessed her peritoneum with an Optiview trocar. There was a bruised portion of the omentum following this as her omentum is stuck to her abdominal wall in the left upper quadrant as well as her splenic flexure. There was no evidence of any other injury president I explored this area completely to rule that out. I then viewed the hernia. This was about a 9 mm hernia. I elected to fix this in an open fashion with mesh. I then made an infraumbilical incision. I encircled the umbilical stalk and divided it. I then inserted a 6.4 cm Ventralight umbilical patch. I then closed the defect with 0 Ethibond sutures as well as tacking the ribbons of the mesh down to this as well. This completely obliterated the defect. I then inserted an additional 5 mm trocar in the left lower quadrant. I used to secure  strap tacker to secure the mesh to the abdominal wall and make sure that this laid flat. This looked good upon completion. Pictures were taken. I then removed my trochars and desufflated the abdomen. I closed these with 4 Monocryl ,Dermabond and Steri-Strips. I then sutured the umbilicus back down to the fascia with 3-0 undyed Vicryl. I then closed this incision with 3-0 Vicryl, 4 Monocryl, and Dermabond and Steri-Strips. At the end some of the instruments had not been counted at the beginning. I did do an x-ray per protocol and there were no instruments present in the abdomen as I had thought. She was then extubated and transferred to recovery stable.

## 2013-04-22 NOTE — Interval H&P Note (Signed)
History and Physical Interval Note:  04/22/2013 1:35 PM  Alicia Frazier  has presented today for surgery, with the diagnosis of Umbilical hernia  The various methods of treatment have been discussed with the patient and family. After consideration of risks, benefits and other options for treatment, the patient has consented to  Procedure(s): LAPAROSCOPY DIAGNOSTIC (N/A) HERNIA REPAIR UMBILICAL ADULT (N/A) INSERTION OF MESH (N/A) as a surgical intervention .  The patient's history has been reviewed, patient examined, no change in status, stable for surgery.  I have reviewed the patient's chart and labs.  Questions were answered to the patient's satisfaction.     [name]

## 2013-04-22 NOTE — Anesthesia Procedure Notes (Signed)
Procedure Name: Intubation Performed by: Terrance Mass Pre-anesthesia Checklist: Patient identified, Timeout performed, Emergency Drugs available, Suction available and Patient being monitored Patient Re-evaluated:Patient Re-evaluated prior to inductionOxygen Delivery Method: Circle system utilized Preoxygenation: Pre-oxygenation with 100% oxygen Intubation Type: Inhalational induction Ventilation: Mask ventilation without difficulty Laryngoscope Size: Miller and 2 Grade View: Grade I Tube type: Oral Tube size: 7.0 mm Number of attempts: 1 Airway Equipment and Method: Stylet Placement Confirmation: ETT inserted through vocal cords under direct vision,  breath sounds checked- equal and bilateral and positive ETCO2 Secured at: 21 cm Tube secured with: Tape Dental Injury: Teeth and Oropharynx as per pre-operative assessment

## 2013-04-22 NOTE — Transfer of Care (Signed)
Immediate Anesthesia Transfer of Care Note  Patient: Alicia Frazier  Procedure(s) Performed: Procedure(s): LAPAROSCOPY DIAGNOSTIC (N/A) HERNIA REPAIR UMBILICAL ADULT (N/A) INSERTION OF MESH (N/A)  Patient Location: PACU  Anesthesia Type:General  Level of Consciousness: awake, alert  and oriented  Airway & Oxygen Therapy: Patient Spontanous Breathing and Patient connected to face mask oxygen  Post-op Assessment: Report given to PACU RN and Post -op Vital signs reviewed and stable  Post vital signs: Reviewed and stable  Complications: No apparent anesthesia complications

## 2013-04-22 NOTE — Discharge Instructions (Signed)
Post Anesthesia Home Care Instructions  Activity: Get plenty of rest for the remainder of the day. A responsible adult should stay with you for 24 hours following the procedure.  For the next 24 hours, DO NOT: -Drive a car -Paediatric nurse -Drink alcoholic beverages -Take any medication unless instructed by your physician -Make any legal decisions or sign important papers.  Meals: Start with liquid foods such as gelatin or soup. Progress to regular foods as tolerated. Avoid greasy, spicy, heavy foods. If nausea and/or vomiting occur, drink only clear liquids until the nausea and/or vomiting subsides. Call your physician if vomiting continues.  Special Instructions/Symptoms: Your throat may feel dry or sore from the anesthesia or the breathing tube placed in your throat during surgery. If this causes discomfort, gargle with warm salt water. The discomfort should disappear within 24 hours.                 [facility] LAPAROSCOPIC SURGERY: POST OP INSTRUCTIONS  Always review your discharge instruction sheet given to you by the facility where your surgery was performed. IF YOU HAVE DISABILITY OR FAMILY LEAVE FORMS, YOU MUST BRING THEM TO THE OFFICE FOR PROCESSING.   DO NOT GIVE THEM TO YOUR DOCTOR.  1. A prescription for pain medication may be given to you upon discharge.  Take your pain medication as prescribed, if needed.  If narcotic pain medicine is not needed, then you may take acetaminophen (Tylenol), naprosyn (Alleve), or ibuprofen (Advil) as needed. 2. Take your usually prescribed medications unless otherwise directed. 3. If you need a refill on your pain medication, please contact your pharmacy.  They will contact our office to request authorization. Prescriptions will not be filled after 5pm or on week-ends. 4. You should follow a light diet the first few days after arrival home, such as soup and crackers, etc.  Be sure to  include lots of fluids daily. 5. Most patients will experience some swelling and bruising in the area of the incisions.  Ice packs will help.  Swelling and bruising can take several days to resolve.  6. It is common to experience some constipation if taking pain medication after surgery.  Increasing fluid intake and taking a stool softener (such as Colace) will usually help or prevent this problem from occurring.  A mild laxative (Milk of Magnesia or Miralax) should be taken according to package instructions if there are no bowel movements after 48 hours. 7. Unless discharge instructions indicate otherwise, you may remove your bandages 48 hours after surgery, and you may shower at that time.  You may have steri-strips (small skin tapes) in place directly over the incision.  These strips should be left on the skin for 7-10 days.  If your surgeon used skin glue on the incision, you may shower in 24 hours.  The glue will flake off over the next 2-3 weeks.  Any sutures or staples will be removed at the office during your follow-up visit. 8. ACTIVITIES:  You may resume regular (light) daily activities beginning the next day--such as daily self-care, walking, climbing stairs--gradually increasing activities as tolerated.  You may have sexual intercourse when it is comfortable.  Refrain from any heavy lifting or straining until approved by your doctor. a. You may drive when you are no longer taking prescription pain medication, you can comfortably wear a seatbelt, and you can safely maneuver your car and apply brakes. b. RETURN TO WORK:  __________________________________________________________ 9. You should see your doctor in  the office for a follow-up appointment approximately 2-3 weeks after your surgery.  Make sure that you call for this appointment within a day or two after you arrive home to insure a convenient appointment time. 10. OTHER INSTRUCTIONS:  __________________________________________________________________________________________________________________________ __________________________________________________________________________________________________________________________ WHEN TO CALL YOUR DOCTOR: 1. Fever over 101.0 2. Inability to urinate 3. Continued bleeding from incision. 4. Increased pain, redness, or drainage from the incision. 5. Increasing abdominal pain  The clinic staff is available to answer your questions during regular business hours.  Please dont hesitate to call and ask to speak to one of the nurses for clinical concerns.  If you have a medical emergency, go to the nearest emergency room or call 911.  A surgeon from [facility] is always on call at the hospital. [address], [place], [place], [place]  [address] ? [address], [place], [place]   [address] [phone] ? [phone] ? FAX [phone] Web site: [email or web address]

## 2013-04-22 NOTE — H&P (View-Only) (Signed)
Patient ID: Alicia Frazier, female   DOB: Jan 13, 1971, 43 y.o.   MRN: [id number]  Chief Complaint  Patient presents with  . Follow-up    Recheck incisional umb hernia    HPI Alicia Frazier is a 43 y.o. female.   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 She has history since last child of pain and a bulge that she reduces at her umbilicus. She has undergone a diagnostic laparoscopy via umbilicus for endometriosis making this an incisional hernia. She reports no other changes since our last visit and has been running a lot. She does hot yoga and long distance running. This area causes her pain when doing these activities esp after long distances.   History reviewed. No pertinent past medical history.  History reviewed. No pertinent past surgical history.  History reviewed. No pertinent family history.  Social History History  Substance Use Topics  . Smoking status: Never Smoker   . Smokeless tobacco: Never Used  . Alcohol Use: Not on file    Allergies  Allergen Reactions  . Compazine Other (See Comments)    hallucination  . Sulfa Antibiotics Rash    Current Outpatient Prescriptions  Medication Sig Dispense Refill  . levonorgestrel (MIRENA) 20 MCG/24HR IUD 1 each by Intrauterine route once.      Marland Kitchen levothyroxine (SYNTHROID, LEVOTHROID) 200 MCG tablet Take 200 mcg by mouth daily.      . montelukast (SINGULAIR) 10 MG tablet Take 10 mg by mouth at bedtime.       No current facility-administered medications for this visit.    Review of Systems Review of Systems  Constitutional: Negative for fever, chills and unexpected weight change.  HENT: Negative for congestion, hearing loss, sore throat, trouble swallowing and voice change.   Eyes: Negative for visual disturbance.  Respiratory: Negative for cough and wheezing.   Cardiovascular: Negative for chest pain, palpitations and leg  swelling.  Gastrointestinal: Negative for nausea, vomiting, abdominal pain, diarrhea, constipation, blood in stool, abdominal distention and anal bleeding.  Genitourinary: Negative for hematuria, vaginal bleeding and difficulty urinating.  Musculoskeletal: Negative for arthralgias.  Skin: Negative for rash and wound.  Neurological: Negative for seizures, syncope and headaches.  Hematological: Negative for adenopathy. Does not bruise/bleed easily.  Psychiatric/Behavioral: Negative for confusion.    Blood pressure 110/74, pulse 70, resp. rate 16, height 5' 6"  (1.676 m), weight 173 lb (78.472 kg).  Physical Exam Physical Exam  Vitals reviewed. Constitutional: She appears well-developed and well-nourished.  Cardiovascular: Normal rate and regular rhythm.   Pulmonary/Chest: Effort normal.  Abdominal: She exhibits no distension. There is no tenderness. A hernia is present. Hernia confirmed positive in the ventral area.       Assessment    Incisional hernia    Plan    We discussed all the options for repair. I would fix this with mesh given its nature as incisional hernia.  We discussed a laparoscopic vs open repair. I think that an open repair similar to an umbilical hernia repair with mesh is best option for her. I will leave laparoscope as option if she has any adhesions or this is difficult also. I think evaluating with laparoscopy first and then deciding on repair is reasonable.  We went over both repairs and I think for repair, cosmesis and back to activity level this would be best option. There is risk of bleeding,  infection, recurrence, injury requiring repair. She is going to let me know when she wants to have this repaired. I will see her back prior to the surgery also.         Masashi Snowdon 04/10/2013, 2:14 PM

## 2013-04-22 NOTE — Anesthesia Preprocedure Evaluation (Addendum)
Anesthesia Evaluation  Patient identified by MRN, date of birth, ID band Patient awake    Reviewed: Allergy & Precautions, H&P , NPO status , Patient's Chart, lab work & pertinent test results  Airway Mallampati: I TM Distance: >3 FB Neck ROM: Full    Dental no notable dental hx. (+) Teeth Intact, Dental Advisory Given   Pulmonary asthma ,  breath sounds clear to auscultation  Pulmonary exam normal       Cardiovascular negative cardio ROS  Rhythm:Regular Rate:Normal     Neuro/Psych negative neurological ROS  negative psych ROS   GI/Hepatic negative GI ROS, Neg liver ROS,   Endo/Other  Hypothyroidism   Renal/GU negative Renal ROS  negative genitourinary   Musculoskeletal   Abdominal   Peds  Hematology negative hematology ROS (+)   Anesthesia Other Findings   Reproductive/Obstetrics negative OB ROS                          Anesthesia Physical Anesthesia Plan  ASA: II  Anesthesia Plan: General   Post-op Pain Management:    Induction: Intravenous  Airway Management Planned: Oral ETT  Additional Equipment:   Intra-op Plan:   Post-operative Plan: Extubation in OR  Informed Consent: I have reviewed the patients History and Physical, chart, labs and discussed the procedure including the risks, benefits and alternatives for the proposed anesthesia with the patient or authorized representative who has indicated his/her understanding and acceptance.   Dental advisory given  Plan Discussed with: CRNA  Anesthesia Plan Comments:         Anesthesia Quick Evaluation

## 2013-04-23 ENCOUNTER — Encounter (HOSPITAL_BASED_OUTPATIENT_CLINIC_OR_DEPARTMENT_OTHER): Payer: Self-pay | Admitting: General Surgery

## 2013-04-23 LAB — POCT HEMOGLOBIN-HEMACUE: Hemoglobin: 15.3 g/dL — ABNORMAL HIGH (ref 12.0–15.0)

## 2013-04-23 NOTE — Anesthesia Postprocedure Evaluation (Signed)
  Anesthesia Post-op Note  Patient: Alicia Frazier  Procedure(s) Performed: Procedure(s): LAPAROSCOPY DIAGNOSTIC (N/A) HERNIA REPAIR UMBILICAL ADULT (N/A) INSERTION OF MESH (N/A)  Patient Location: PACU  Anesthesia Type:General  Level of Consciousness: awake and alert   Airway and Oxygen Therapy: Patient Spontanous Breathing  Post-op Pain: mild  Post-op Assessment: Post-op Vital signs reviewed, Patient's Cardiovascular Status Stable and Respiratory Function Stable  Post-op Vital Signs: Reviewed  Filed Vitals:   04/22/13 1825  BP: 126/76  Pulse: 84  Temp: 36.7 C  Resp: 16    Complications: No apparent anesthesia complications

## 2013-05-04 ENCOUNTER — Other Ambulatory Visit: Payer: Self-pay | Admitting: Dermatology

## 2013-05-07 ENCOUNTER — Ambulatory Visit (INDEPENDENT_AMBULATORY_CARE_PROVIDER_SITE_OTHER): Payer: Commercial Managed Care - PPO | Admitting: General Surgery

## 2013-05-07 ENCOUNTER — Encounter (INDEPENDENT_AMBULATORY_CARE_PROVIDER_SITE_OTHER): Payer: Self-pay | Admitting: General Surgery

## 2013-05-07 VITALS — BP 118/64 | HR 74 | Temp 98.0°F | Resp 18 | Ht 66.0 in | Wt 169.0 lb

## 2013-05-07 DIAGNOSIS — Z09 Encounter for follow-up examination after completed treatment for conditions other than malignant neoplasm: Secondary | ICD-10-CM

## 2013-05-07 NOTE — Progress Notes (Signed)
Subjective:     Patient ID: Alicia Frazier, female   DOB: [birth year], 43 y.o.   MRN: [id number]  HPI This is a 43 year old female who underwent a diagnostic laparoscopy with repair of a incisional hernia at the umbilicus with a umbilical patch. I tacked this to the abdominal wall with the laparoscope as well. She had some pain for the first couple of days but this is quickly subsided. She is doing very well today. She really has no complaints at all today.  Review of Systems     Objective:   Physical Exam Incision is healing well without evidence of infection    Assessment:     Status post incisional hernia repair with mesh     Plan:     I told her she can begin returning to her normal activity slowly. I will plan on seeing her back as needed.

## 2013-10-12 ENCOUNTER — Emergency Department (HOSPITAL_COMMUNITY)
Admission: EM | Admit: 2013-10-12 | Discharge: 2013-10-12 | Disposition: A | Discharge status: Home / Self Care | Payer: 59 | Attending: Emergency Medicine | Admitting: Emergency Medicine

## 2013-10-12 ENCOUNTER — Emergency Department (HOSPITAL_COMMUNITY): Payer: 59

## 2013-10-12 ENCOUNTER — Encounter (HOSPITAL_COMMUNITY): Payer: Self-pay | Admitting: Emergency Medicine

## 2013-10-12 DIAGNOSIS — R110 Nausea: Secondary | ICD-10-CM | POA: Insufficient documentation

## 2013-10-12 DIAGNOSIS — R1011 Right upper quadrant pain: Secondary | ICD-10-CM | POA: Insufficient documentation

## 2013-10-12 DIAGNOSIS — E039 Hypothyroidism, unspecified: Secondary | ICD-10-CM | POA: Insufficient documentation

## 2013-10-12 DIAGNOSIS — Z79899 Other long term (current) drug therapy: Secondary | ICD-10-CM | POA: Insufficient documentation

## 2013-10-12 DIAGNOSIS — Z3202 Encounter for pregnancy test, result negative: Secondary | ICD-10-CM | POA: Insufficient documentation

## 2013-10-12 DIAGNOSIS — Z8719 Personal history of other diseases of the digestive system: Secondary | ICD-10-CM | POA: Insufficient documentation

## 2013-10-12 LAB — I-STAT TROPONIN, ED: Troponin i, poc: 0 ng/mL (ref 0.00–0.08)

## 2013-10-12 LAB — URINALYSIS, ROUTINE W REFLEX MICROSCOPIC
BILIRUBIN URINE: NEGATIVE
Glucose, UA: NEGATIVE mg/dL
Hgb urine dipstick: NEGATIVE
Ketones, ur: NEGATIVE mg/dL
LEUKOCYTES UA: NEGATIVE
NITRITE: NEGATIVE
PH: 6 (ref 5.0–8.0)
Protein, ur: NEGATIVE mg/dL
SPECIFIC GRAVITY, URINE: 1.01 (ref 1.005–1.030)
UROBILINOGEN UA: 0.2 mg/dL (ref 0.0–1.0)

## 2013-10-12 LAB — CBC WITH DIFFERENTIAL/PLATELET
BASOS PCT: 0 % (ref 0–1)
Basophils Absolute: 0 10*3/uL (ref 0.0–0.1)
Eosinophils Absolute: 0.1 10*3/uL (ref 0.0–0.7)
Eosinophils Relative: 1 % (ref 0–5)
HCT: 40.7 % (ref 36.0–46.0)
HEMOGLOBIN: 13.3 g/dL (ref 12.0–15.0)
Lymphocytes Relative: 10 % — ABNORMAL LOW (ref 12–46)
Lymphs Abs: 0.7 10*3/uL (ref 0.7–4.0)
MCH: 29.6 pg (ref 26.0–34.0)
MCHC: 32.7 g/dL (ref 30.0–36.0)
MCV: 90.4 fL (ref 78.0–100.0)
MONOS PCT: 11 % (ref 3–12)
Monocytes Absolute: 0.8 10*3/uL (ref 0.1–1.0)
NEUTROS ABS: 5.3 10*3/uL (ref 1.7–7.7)
Neutrophils Relative %: 78 % — ABNORMAL HIGH (ref 43–77)
Platelets: 189 10*3/uL (ref 150–400)
RBC: 4.5 MIL/uL (ref 3.87–5.11)
RDW: 12.9 % (ref 11.5–15.5)
WBC: 6.8 10*3/uL (ref 4.0–10.5)

## 2013-10-12 LAB — COMPREHENSIVE METABOLIC PANEL
ALBUMIN: 4 g/dL (ref 3.5–5.2)
ALK PHOS: 74 U/L (ref 39–117)
ALT: 23 U/L (ref 0–35)
AST: 24 U/L (ref 0–37)
Anion gap: 12 (ref 5–15)
BILIRUBIN TOTAL: 0.2 mg/dL — AB (ref 0.3–1.2)
BUN: 11 mg/dL (ref 6–23)
CHLORIDE: 104 meq/L (ref 96–112)
CO2: 24 mEq/L (ref 19–32)
Calcium: 8.6 mg/dL (ref 8.4–10.5)
Creatinine, Ser: 0.74 mg/dL (ref 0.50–1.10)
GFR calc Af Amer: >90 mL/min (ref >90)
GFR calc non Af Amer: >90 mL/min (ref >90)
Glucose, Bld: 101 mg/dL — ABNORMAL HIGH (ref 70–99)
POTASSIUM: 4.2 meq/L (ref 3.7–5.3)
SODIUM: 140 meq/L (ref 137–147)
Total Protein: 6.7 g/dL (ref 6.0–8.3)

## 2013-10-12 LAB — LIPASE, BLOOD: Lipase: 26 U/L (ref 11–59)

## 2013-10-12 LAB — POC URINE PREG, ED: PREG TEST UR: NEGATIVE

## 2013-10-12 MED ORDER — TAMSULOSIN HCL 0.4 MG PO CAPS: 0.4000 mg | ORAL_CAPSULE | Freq: Once | ORAL | Status: DC

## 2013-10-12 MED ORDER — ONDANSETRON 8 MG PO TBDP: 8.0000 mg | ORAL_TABLET | Freq: Three times a day (TID) | ORAL | Status: DC | PRN

## 2013-10-12 MED ORDER — HYDROCODONE-ACETAMINOPHEN 5-325 MG PO TABS: 1.0000 | ORAL_TABLET | Freq: Four times a day (QID) | ORAL | Status: DC | PRN

## 2013-10-12 MED ORDER — MORPHINE SULFATE 4 MG/ML IJ SOLN: 4.0000 mg | INTRAMUSCULAR | Status: DC | PRN

## 2013-10-12 MED ORDER — IBUPROFEN 600 MG PO TABS: 600.0000 mg | ORAL_TABLET | Freq: Four times a day (QID) | ORAL | Status: DC | PRN

## 2013-10-12 NOTE — ED Provider Notes (Signed)
CSN: [id number]     Arrival date & time 10/12/13  1719 History   First MD Initiated Contact with Patient 10/12/13 1825     Chief Complaint  Patient presents with  . Abdominal Pain     (Consider location/radiation/quality/duration/timing/severity/associated sxs/prior Treatment) HPI Comments: Pt comes in with cc of abd pain. Pt had sudden onset abd pain, right sided, mostly upper quadrant. The pain was severe, and lasted for at least 1.5 hour - and so pt came to the ER. Pt states that as soon as she laid down, her pain improved. Pt had nausea and she reports some pleuritic component to the pain when she had it. No diarrhea, no cough, no diaphoresis. Pt has hx of renal stones- but that pain was in the back. Pt passed the stone by herself. Pt denies any hx of fibroids, ovarian cyst, vaginal d/c or bleeding. Pain better since she has laid down. No hx of similar pain.  Patient is a 43 y.o. female presenting with abdominal pain. The history is provided by the patient.  Abdominal Pain Associated symptoms: nausea   Associated symptoms: no chest pain, no constipation, no cough, no diarrhea, no dysuria, no hematuria, no shortness of breath, no vaginal bleeding, no vaginal discharge and no vomiting     Past Medical History  Diagnosis Date  . Hypothyroidism   . History of Graves' disease   . Umbilical hernia 06/2351   Past Surgical History  Procedure Laterality Date  . Septoplasty with ethmoidectomy, and maxillary antrostomy  02/11/2008  . Nasal turbinate reduction Bilateral 02/10/2008    inferior  . Endometrial ablation  03/21/2008  . Chromopertubation  03/21/2008  . Umbilical hernia repair N/A 04/22/2013    Procedure: LAPAROSCOPY DIAGNOSTIC;  Surgeon: Rolm Bookbinder, MD;  Location: [place];  Service: General;  Laterality: N/A;  . Umbilical hernia repair N/A 04/22/2013    Procedure: HERNIA REPAIR UMBILICAL ADULT;  Surgeon: Rolm Bookbinder, MD;  Location: [place];  Service: General;  Laterality: N/A;  . Insertion of mesh N/A 04/22/2013    Procedure: INSERTION OF MESH;  Surgeon: Rolm Bookbinder, MD;  Location: [facility];  Service: General;  Laterality: N/A;   No family history on file. History  Substance Use Topics  . Smoking status: Never Smoker   . Smokeless tobacco: Never Used  . Alcohol Use: Yes     Comment: occasionally   OB History   Grav Para Term Preterm Abortions TAB SAB Ect Mult Living                 Review of Systems  Constitutional: Negative for activity change.  HENT: Negative for facial swelling.   Respiratory: Negative for cough, shortness of breath and wheezing.   Cardiovascular: Negative for chest pain.  Gastrointestinal: Positive for nausea and abdominal pain. Negative for vomiting, diarrhea, constipation, blood in stool and abdominal distention.  Genitourinary: Negative for dysuria, hematuria, vaginal bleeding, vaginal discharge and difficulty urinating.  Musculoskeletal: Negative for neck pain.  Skin: Negative for color change.  Neurological: Negative for speech difficulty.  Hematological: Does not bruise/bleed easily.  Psychiatric/Behavioral: Negative for confusion.      Allergies  Compazine and Sulfa antibiotics  Home Medications   Prior to Admission medications   Medication Sig Start Date End Date Taking? Authorizing Provider  albuterol (PROVENTIL HFA;VENTOLIN HFA) 108 (90 BASE) MCG/ACT inhaler Inhale 1-2 puffs into the lungs 2 (two) times daily as needed for wheezing or shortness of breath.  Yes Historical Provider, MD  ibuprofen (ADVIL,MOTRIN) 200 MG tablet Take 400 mg by mouth daily as needed for headache.   Yes Historical Provider, MD  levonorgestrel (MIRENA) 20 MCG/24HR IUD 1 each by Intrauterine route once. Implanted October 2011   Yes Historical Provider, MD  levothyroxine (SYNTHROID, LEVOTHROID) 200 MCG tablet Take 200 mcg by mouth daily. Take with a 25 mcg tablet for a 200  mcg dose   Yes Historical Provider, MD  levothyroxine (SYNTHROID, LEVOTHROID) 25 MCG tablet Take 25 mcg by mouth daily before breakfast. Take with a 200 mcg tablet for a 225 mcg dose   Yes Historical Provider, MD  montelukast (SINGULAIR) 10 MG tablet Take 10 mg by mouth at bedtime.   Yes Historical Provider, MD  Multiple Vitamin (MULTIVITAMIN WITH MINERALS) TABS tablet Take 1 tablet by mouth daily.   Yes Historical Provider, MD  Pseudoeph-Doxylamine-DM-APAP (NYQUIL PO) Take 2 capsules by mouth at bedtime as needed (congestion).   Yes Historical Provider, MD  HYDROcodone-acetaminophen (NORCO/VICODIN) 5-325 MG per tablet Take 1 tablet by mouth every 6 (six) hours as needed. 10/12/13   Varney Biles, MD  ibuprofen (ADVIL,MOTRIN) 600 MG tablet Take 1 tablet (600 mg total) by mouth every 6 (six) hours as needed. 10/12/13   Varney Biles, MD  ondansetron (ZOFRAN ODT) 8 MG disintegrating tablet Take 1 tablet (8 mg total) by mouth every 8 (eight) hours as needed for nausea. 10/12/13   Varney Biles, MD  tamsulosin (FLOMAX) 0.4 MG CAPS capsule Take 1 capsule (0.4 mg total) by mouth once. 10/12/13   Bemnet Trovato, MD   BP 127/58  Pulse 85  Temp(Src) 97.9 F (36.6 C) (Oral)  Resp 18  SpO2 97% Physical Exam  Nursing note and vitals reviewed. Constitutional: She is oriented to person, place, and time. She appears well-developed and well-nourished.  HENT:  Head: Normocephalic and atraumatic.  Eyes: EOM are normal. Pupils are equal, round, and reactive to light.  Neck: Neck supple.  Cardiovascular: Normal rate, regular rhythm and normal heart sounds.   No murmur heard. Pulmonary/Chest: Effort normal. No respiratory distress.  Abdominal: Soft. She exhibits no distension. There is tenderness. There is no rebound and no guarding.  RUQ tenderness, with mild guarding, neg murphy's, no rebound, no rigidity. Mild RLQ tenderness. No flank tenderness.  Neurological: She is alert and oriented to person, place, and  time.  Skin: Skin is warm and dry.    ED Course  Procedures (including critical care time) Labs Review Labs Reviewed  CBC WITH DIFFERENTIAL - Abnormal; Notable for the following:    Neutrophils Relative % 78 (*)    Lymphocytes Relative 10 (*)    All other components within normal limits  COMPREHENSIVE METABOLIC PANEL - Abnormal; Notable for the following:    Glucose, Bld 101 (*)    Total Bilirubin 0.2 (*)    All other components within normal limits  LIPASE, BLOOD  URINALYSIS, ROUTINE W REFLEX MICROSCOPIC  I-STAT TROPOININ, ED  POC URINE PREG, ED    Imaging Review US Abdomen Complete  10/12/2013   CLINICAL DATA:  Right upper quadrant abdominal pain  EXAM: ULTRASOUND ABDOMEN COMPLETE  COMPARISON:  None.  FINDINGS: Gallbladder:  No gallstones or wall thickening visualized. No sonographic Murphy sign noted.  Common bile duct:  Diameter: 3 mm  Liver:  No focal lesion identified. Within normal limits in parenchymal echogenicity.  IVC:  No abnormality visualized.  Pancreas:  Poorly visualized  Spleen:  Size and appearance within normal limits.  Right  Kidney:  Length: 12.0 cm. Echogenicity within normal limits. No mass or hydronephrosis visualized.  Left Kidney:  Length: 12.2 cm. Echogenicity within normal limits. No mass or hydronephrosis visualized.  Abdominal aorta:  No aneurysm visualized.  Other findings:  None.  IMPRESSION: Unremarkable abdominal ultrasound.   Electronically Signed   By: Lovey Newcomer M.D.   On: 10/12/2013 20:32     EKG Interpretation   Date/Time:  Tuesday October 12 2013 17:47:27 EDT Ventricular Rate:  112 PR Interval:  132 QRS Duration: 72 QT Interval:  358 QTC Calculation: 488 R Axis:   71 Text Interpretation:  Sinus tachycardia Right atrial enlargement  Borderline ECG inferior and lateral q waves Confirmed by Kathrynn Humble, MD,  Thelma Comp [phone]) on 10/12/2013 6:26:21 PM      MDM   Final diagnoses:  Right upper quadrant abdominal pain    DDx  includes: Pancreatitis Hepatobiliary pathology including cholecystitis Gastritis/PUD SBO ACS syndrome Aortic Dissection Intra abdominal abscess Thrombosis Mesenteric ischemia Diverticulitis Peritonitis Appendicitis Hernia Nephrolithiasis Pyelonephritis UTI/Cystitis Ovarian cyst TOA Ectopic pregnancy PID   Pt comes in with sudden onset severe abd pain. Pain is now better, but still present, and feel burning type and is located in the RUQ.  Given the sudden onset of the pain - vascular etiology in the ddx, + renal stones and gall stones, pancreatitis. Pt has no peritoneal signs,and no rigidity or rebound, so unlikely perforation. Pain mainly in the RUQ - [place] ordered - and is negative. No hydronephrosis seen, in case patient has a stone, she will likely pass it as she did before. Pt has no risk factors for PE. I went over the results with the patient. She states that she only hurts when the abd is palpated.  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 She will get some meds for her sx.   DDX: Possible Ovarian torsion, Ovarian Cyst, Infarct - hepatic/renal.            Varney Biles, MD 10/12/13 2230

## 2013-10-12 NOTE — ED Notes (Signed)
First contact with patient. Pt states pain resolved at this time without intervention. MD informed. Hold labs until interviewed by MD

## 2013-10-12 NOTE — ED Notes (Signed)
Pt verbalizes understanding of d/c instructions and denies any further needs at this time.

## 2013-10-12 NOTE — ED Notes (Signed)
Pt to US.

## 2013-10-12 NOTE — ED Notes (Signed)
Pt is here with severe pain to right upper and lower abdominal pain and describes it as burning.  Reports nausea.  Pt reports painful to breath

## 2013-10-12 NOTE — ED Notes (Signed)
Pt refused pain medication at this time.

## 2013-10-12 NOTE — Discharge Instructions (Signed)
We saw you in the ER for the abdominal pain. All of our results are normal, including all labs and imaging. Kidney function is fine as well. We are not sure what is causing your abdominal pain, and recommend that you see your primary care doctor within 2-3 days for further evaluation. If your symptoms get worse, return to the ER. Take the pain meds and nausea meds as prescribed.   Abdominal Pain, Women Abdominal (stomach, pelvic, or belly) pain can be caused by many things. It is important to tell your doctor:  The location of the pain.  Does it come and go or is it present all the time?  Are there things that start the pain (eating certain foods, exercise)?  Are there other symptoms associated with the pain (fever, nausea, vomiting, diarrhea)? All of this is helpful to know when trying to find the cause of the pain. CAUSES   Stomach: virus or bacteria infection, or ulcer.  Intestine: appendicitis (inflamed appendix), regional ileitis (Crohn's disease), ulcerative colitis (inflamed colon), irritable bowel syndrome, diverticulitis (inflamed diverticulum of the colon), or cancer of the stomach or intestine.  Gallbladder disease or stones in the gallbladder.  Kidney disease, kidney stones, or infection.  Pancreas infection or cancer.  Fibromyalgia (pain disorder).  Diseases of the female organs:  Uterus: fibroid (non-cancerous) tumors or infection.  Fallopian tubes: infection or tubal pregnancy.  Ovary: cysts or tumors.  Pelvic adhesions (scar tissue).  Endometriosis (uterus lining tissue growing in the pelvis and on the pelvic organs).  Pelvic congestion syndrome (female organs filling up with blood just before the menstrual period).  Pain with the menstrual period.  Pain with ovulation (producing an egg).  Pain with an IUD (intrauterine device, birth control) in the uterus.  Cancer of the female organs.  Functional pain (pain not caused by a disease, may improve  without treatment).  Psychological pain.  Depression. DIAGNOSIS  Your doctor will decide the seriousness of your pain by doing an examination.  Blood tests.  X-rays.  Ultrasound.  CT scan (computed tomography, special type of X-ray).  MRI (magnetic resonance imaging).  Cultures, for infection.  Barium enema (dye inserted in the large intestine, to better view it with X-rays).  Colonoscopy (looking in intestine with a lighted tube).  Laparoscopy (minor surgery, looking in abdomen with a lighted tube).  Major abdominal exploratory surgery (looking in abdomen with a large incision). TREATMENT  The treatment will depend on the cause of the pain.   Many cases can be observed and treated at home.  Over-the-counter medicines recommended by your caregiver.  Prescription medicine.  Antibiotics, for infection.  Birth control pills, for painful periods or for ovulation pain.  Hormone treatment, for endometriosis.  Nerve blocking injections.  Physical therapy.  Antidepressants.  Counseling with a psychologist or psychiatrist.  Minor or major surgery. HOME CARE INSTRUCTIONS   Do not take laxatives, unless directed by your caregiver.  Take over-the-counter pain medicine only if ordered by your caregiver. Do not take aspirin because it can cause an upset stomach or bleeding.  Try a clear liquid diet (broth or water) as ordered by your caregiver. Slowly move to a bland diet, as tolerated, if the pain is related to the stomach or intestine.  Have a thermometer and take your temperature several times a day, and record it.  Bed rest and sleep, if it helps the pain.  Avoid sexual intercourse, if it causes pain.  Avoid stressful situations.  Keep your follow-up appointments and  tests, as your caregiver orders.  If the pain does not go away with medicine or surgery, you may try:  Acupuncture.  Relaxation exercises (yoga, meditation).  Group  therapy.  Counseling. SEEK MEDICAL CARE IF:   You notice certain foods cause stomach pain.  Your home care treatment is not helping your pain.  You need stronger pain medicine.  You want your IUD removed.  You feel faint or lightheaded.  You develop nausea and vomiting.  You develop a rash.  You are having side effects or an allergy to your medicine. SEEK IMMEDIATE MEDICAL CARE IF:   Your pain does not go away or gets worse.  You have a fever.  Your pain is felt only in portions of the abdomen. The right side could possibly be appendicitis. The left lower portion of the abdomen could be colitis or diverticulitis.  You are passing blood in your stools (bright red or black tarry stools, with or without vomiting).  You have blood in your urine.  You develop chills, with or without a fever.  You pass out. MAKE SURE YOU:   Understand these instructions.  Will watch your condition.  Will get help right away if you are not doing well or get worse. Document Released: 12/23/2006 Document Revised: 07/12/2013 Document Reviewed: 01/12/2009 [facility] Patient Information 2015 [place], [place]. This information is not intended to replace advice given to you by your health care provider. Make sure you discuss any questions you have with your health care provider.

## 2013-11-01 ENCOUNTER — Ambulatory Visit
Admission: RE | Admit: 2013-11-01 | Discharge: 2013-11-01 | Disposition: A | Discharge status: Home / Self Care | Payer: 59 | Source: Ambulatory Visit | Attending: Family | Admitting: Family

## 2013-11-01 ENCOUNTER — Other Ambulatory Visit: Payer: Self-pay | Admitting: Family

## 2013-11-01 DIAGNOSIS — R0989 Other specified symptoms and signs involving the circulatory and respiratory systems: Secondary | ICD-10-CM

## 2013-11-01 DIAGNOSIS — J45901 Unspecified asthma with (acute) exacerbation: Secondary | ICD-10-CM

## 2013-11-03 ENCOUNTER — Institutional Professional Consult (permissible substitution): Payer: 59 | Admitting: Internal Medicine

## 2013-12-01 ENCOUNTER — Telehealth: Payer: Self-pay | Admitting: Internal Medicine

## 2013-12-01 NOTE — Telephone Encounter (Signed)
Alicia Frazier is the wife of [name] of [employer] lab [facility] is CRNA in hospital. She would like to be seen asap for dyspnea. Please cal her on cell [phone]. I thought I will see her tomorrow but she is traveling. She is new consult. I think I would like to see her < 2 weeks. IF that is not possible let me know  Thanks  Dr. Brand Males, M.D., [facility].C.P Pulmonary and Critical Care Medicine Staff Physician [place] [facility] Pager: [phone], If no answer or between  15:00h - 7:00h: call [phone]  12/01/2013 10:36 AM

## 2013-12-01 NOTE — Telephone Encounter (Signed)
Called and spoke to pt. Consult appt made for 10/9. Pt verbalized understanding and denied any further questions or concerns at this time.

## 2013-12-17 ENCOUNTER — Encounter: Payer: Self-pay | Admitting: Internal Medicine

## 2013-12-17 ENCOUNTER — Telehealth: Payer: Self-pay | Admitting: Internal Medicine

## 2013-12-17 ENCOUNTER — Ambulatory Visit (INDEPENDENT_AMBULATORY_CARE_PROVIDER_SITE_OTHER): Payer: 59 | Admitting: Internal Medicine

## 2013-12-17 ENCOUNTER — Other Ambulatory Visit (INDEPENDENT_AMBULATORY_CARE_PROVIDER_SITE_OTHER): Payer: 59

## 2013-12-17 VITALS — BP 126/88 | HR 81 | Ht 64.0 in | Wt 175.0 lb

## 2013-12-17 DIAGNOSIS — R0600 Dyspnea, unspecified: Secondary | ICD-10-CM

## 2013-12-17 LAB — TSH: TSH: 1.13 u[IU]/mL (ref 0.35–4.50)

## 2013-12-17 NOTE — Progress Notes (Signed)
Subjective:    Patient ID: Alicia Frazier, female    DOB: 1971/01/30, 43 y.o.   MRN: [id number] PCP Antony Blackbird, MD  HPI  IOV 12/17/2013  Chief Complaint  Patient presents with  . Pulmonary Consult    Pt here for dyspnea at rest. Pt c/o dry cough and chest tightness with exercise and with deep breaths.      43 year old female. 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  REports past 3-4 years she trains for marathon starting in July each year. By September she hits 13 mile mark and usually around thsi time kids get viral URI and sick. She then has acute bronchitis and typically Rx with prednisone +/- abx  And resolves fast. Therefore for EIB, she is on flovent maintenance. However, this year has been different. She started noticing insidious onset of dyspnea in July 2015. Progressive since then. Moderate in intensity. Can happen at rest but exertion always brings it. Initially noticed it over marathon running and decreased effort tolerance. There is some unusual chest pain that happens with dyspnea - goes from front to back. There is also associated diaphoresis. INitially in early aug 2015 PCP office though diminished Air entry RLL and gave her pred but no response. Then again given another round prednisone. stil not help. Notices neither flovent nor albuterol helping. Husband noticing her to be dyspneic and diaphoretic cleaning things at home. Co-workers notice dry cough  Denies viral illness at onset.   LAbs - Aug 2015: clear cxr  - 12/17/2013 : spirometry normal - 12/17/2013: walk test 185 feet x 3 laps: first time got chest pain and had transient desats to 84% (? Probe issue). 2nd time did not desat and no chest pain - Exhaled NO (research use device) :  22pp - done while on flovent and singulair (< 25 is low prob asthma or well controlled asthma; provided eosinophilic)    has a past medical history of Hypothyroidism; History of Graves' disease; Umbilical hernia  (05/6142); and Kidney stone.   has past surgical history that includes Septoplasty with ethmoidectomy, and maxillary antrostomy (02/11/2008); Nasal turbinate reduction (Bilateral, 02/10/2008); Endometrial ablation (03/21/2008); Chromopertubation (03/21/2008); Umbilical hernia repair (N/A, 04/22/2013); Umbilical hernia repair (N/A, 04/22/2013); and Insertion of mesh (N/A, 04/22/2013).  Current outpatient prescriptions:albuterol (PROVENTIL HFA;VENTOLIN HFA) 108 (90 BASE) MCG/ACT inhaler, Inhale 1-2 puffs into the lungs 2 (two) times daily as needed for wheezing or shortness of breath. , Disp: , Rfl: ;  fluticasone (FLOVENT HFA) 44 MCG/ACT inhaler, Inhale 2 puffs into the lungs 2 (two) times daily., Disp: , Rfl: ;  levonorgestrel (MIRENA) 20 MCG/24HR IUD, 1 each by Intrauterine route once. Implanted October 2011, Disp: , Rfl:  levothyroxine (SYNTHROID, LEVOTHROID) 200 MCG tablet, Take 200 mcg by mouth daily. Take with a 25 mcg tablet for a 200 mcg dose, Disp: , Rfl: ;  levothyroxine (SYNTHROID, LEVOTHROID) 25 MCG tablet, Take 25 mcg by mouth daily before breakfast. Take with a 200 mcg tablet for a 225 mcg dose, Disp: , Rfl: ;  montelukast (SINGULAIR) 10 MG tablet, Take 10 mg by mouth at bedtime., Disp: , Rfl:  Multiple Vitamin (MULTIVITAMIN WITH MINERALS) TABS tablet, Take 1 tablet by mouth daily., Disp: , Rfl:   Immunization History  Administered Date(s) Administered  . Influenza Split 12/13/2013    History  Substance Use Topics  . Smoking status: Never Smoker   . Smokeless tobacco: Never Used  . Alcohol Use: Yes  Comment: occasionally       Review of Systems  Constitutional: Negative for fever and unexpected weight change.  HENT: Negative for congestion, dental problem, ear pain, nosebleeds, postnasal drip, rhinorrhea, sinus pressure, sneezing, sore throat and trouble swallowing.   Eyes: Negative for redness and itching.  Respiratory: Positive for cough, chest tightness and shortness of breath.  Negative for wheezing.   Cardiovascular: Positive for palpitations. Negative for leg swelling.  Gastrointestinal: Negative for nausea and vomiting.  Genitourinary: Negative for dysuria.  Musculoskeletal: Negative for joint swelling.  Skin: Negative for rash.  Neurological: Negative for headaches.  Hematological: Does not bruise/bleed easily.  Psychiatric/Behavioral: Negative for dysphoric mood. The patient is not nervous/anxious.        Objective:   Physical Exam  Vitals reviewed. Constitutional: She is oriented to person, place, and time. She appears well-developed and well-nourished. No distress.  HENT:  Head: Normocephalic and atraumatic.  Right Ear: External ear normal.  Left Ear: External ear normal.  Mouth/Throat: Oropharynx is clear and moist. No oropharyngeal exudate.  Mild exopthalmos +  Eyes: Conjunctivae and EOM are normal. Pupils are equal, round, and reactive to light. Right eye exhibits no discharge. Left eye exhibits no discharge. No scleral icterus.  Neck: Normal range of motion. Neck supple. No JVD present. No tracheal deviation present. No thyromegaly present.  Cardiovascular: Normal rate, regular rhythm, normal heart sounds and intact distal pulses.  Exam reveals no gallop and no friction rub.   No murmur heard. Pulmonary/Chest: Effort normal and breath sounds normal. No respiratory distress. She has no wheezes. She has no rales. She exhibits no tenderness.  Abdominal: Soft. Bowel sounds are normal. She exhibits no distension and no mass. There is no tenderness. There is no rebound and no guarding.  Musculoskeletal: Normal range of motion. She exhibits no edema and no tenderness.  Lymphadenopathy:    She has no cervical adenopathy.  Neurological: She is alert and oriented to person, place, and time. She has normal reflexes. No cranial nerve deficit. She exhibits normal muscle tone. Coordination normal.  Skin: Skin is warm and dry. No rash noted. She is not  diaphoretic. No erythema. No pallor.  Psychiatric: She has a normal mood and affect. Her behavior is normal. Judgment and thought content normal.    Filed Vitals:   12/17/13 1458  BP: 126/88  Pulse: 81  Height: 5' 4"  (1.626 m)  Weight: 175 lb (79.379 kg)  SpO2: 98%         Assessment & Plan:  #Dyspnea   - unexplained - asthma appears controlled on bassis of exhaled NO  - need to test step by step to figure out cause   PLAN  - do TSH and d-dimer  - depending on results will order CT chest - do echo cardiogram with Dr Beverley Fiedler - Depending on results, will order methacholine challenge test +/- Pulmonary stress test

## 2013-12-17 NOTE — Telephone Encounter (Signed)
DAn  This is Eddie Dibbles Xiong wife MAKINZY CLEERE . Can you do an echo or have one oif your colleagues do an echo please. I am concerned there might be cardiomyopathy or pulm htn. Unexplained dyspnea  Thanks  Dr. Brand Males, M.D., [facility].C.P Pulmonary and Critical Care Medicine Staff Physician [place] [facility] Pager: [phone], If no answer or between  15:00h - 7:00h: call [phone]  12/17/2013 3:46 PM

## 2013-12-17 NOTE — Patient Instructions (Addendum)
#  Dyspnea   - unexplained  - need to test step by step to figure out cause   PLAN  - do TSH and d-dimer  - depending on results will order CT chest - do echo cardiogram with Dr Beverley Fiedler - Depending on results, will order methacholine challenge test +/- Pulmonary stress test -

## 2013-12-18 LAB — D-DIMER, QUANTITATIVE: D-Dimer, Quant: 0.27 ug/mL-FEU (ref 0.00–0.48)

## 2013-12-20 NOTE — Telephone Encounter (Signed)
Thanks for seeing here. Will look at echo. She is under tremendous amount of stress with recent events.

## 2013-12-20 NOTE — Telephone Encounter (Signed)
I have not ordered one. Do you want me to order one? With you to read?  Thanks  Dr. Brand Males, M.D., [facility].C.P Pulmonary and Critical Care Medicine Staff Physician [place] [facility] Pager: [phone], If no answer or between  15:00h - 7:00h: call [phone]  12/20/2013 4:46 PM

## 2013-12-21 ENCOUNTER — Ambulatory Visit (HOSPITAL_COMMUNITY)
Admission: RE | Admit: 2013-12-21 | Discharge: 2013-12-21 | Disposition: A | Discharge status: Home / Self Care | Payer: 59 | Source: Ambulatory Visit | Attending: Family Medicine | Admitting: Family Medicine

## 2013-12-21 DIAGNOSIS — I369 Nonrheumatic tricuspid valve disorder, unspecified: Secondary | ICD-10-CM

## 2013-12-21 DIAGNOSIS — R0600 Dyspnea, unspecified: Secondary | ICD-10-CM | POA: Insufficient documentation

## 2013-12-21 NOTE — Progress Notes (Signed)
  Echocardiogram 2D Echocardiogram has been performed.  Alicia Frazier 12/21/2013, 8:28 AM

## 2013-12-22 ENCOUNTER — Telehealth: Payer: Self-pay | Admitting: Internal Medicine

## 2013-12-22 NOTE — Telephone Encounter (Signed)
Thanks. Will close note  Dr. Brand Males, M.D., [facility].C.P Pulmonary and Critical Care Medicine Staff Physician [place] [facility] Pager: [phone], If no answer or between  15:00h - 7:00h: call [phone]  12/22/2013 3:48 PM

## 2013-12-22 NOTE — Telephone Encounter (Signed)
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 He has started lasix. Called patient; she just took first dose. She will see response. IF dyspnea still persists, next steps are CPST +/- right heart cath. She knows to be in touch. Will sign off  Dr. Brand Males, M.D., [facility].C.[facility] Pulmonary [facility] Critical [facility] Medicine Staff Physician [facility] Pager: [phone], If no answer or between  15:00h - 7:00h: call [phone]  12/22/2013 3:52 PM

## 2013-12-22 NOTE — Telephone Encounter (Signed)
Echo reviewed. I suspect she is volume overloaded from prednisone. I have started low dose lasix. Will gauge response.

## 2015-11-27 IMAGING — US US ABDOMEN COMPLETE
1 series · 14 of 25 positions shown · non-contrast
Comparison: None.

CLINICAL DATA: Right upper quadrant abdominal pain

EXAM:
ULTRASOUND ABDOMEN COMPLETE

[Series 1: us abdomen complete · 0.27mm/px · 14 of 80 slices shown]
[im 1/80]
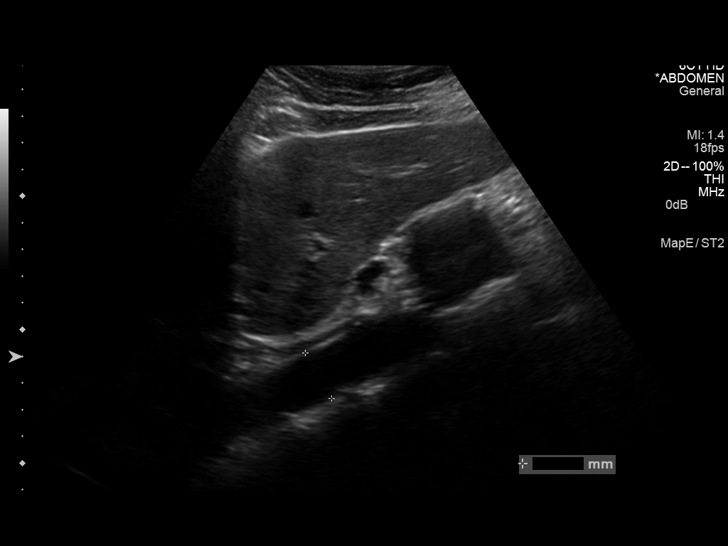
[im 7/80]
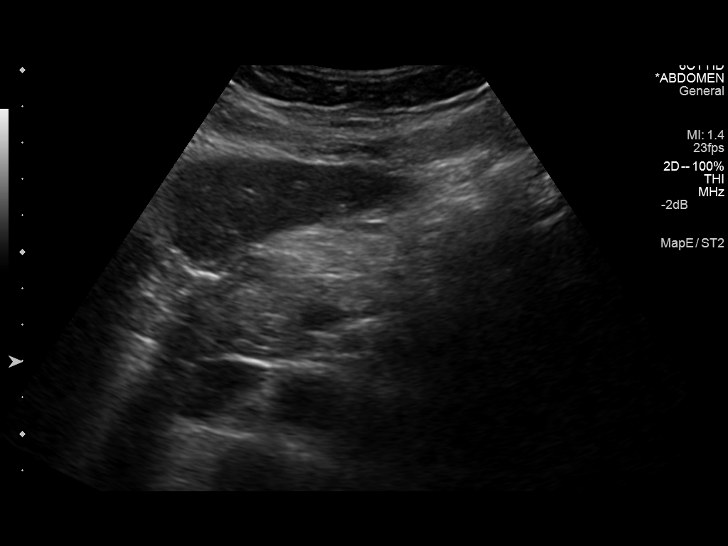
[im 14/80]
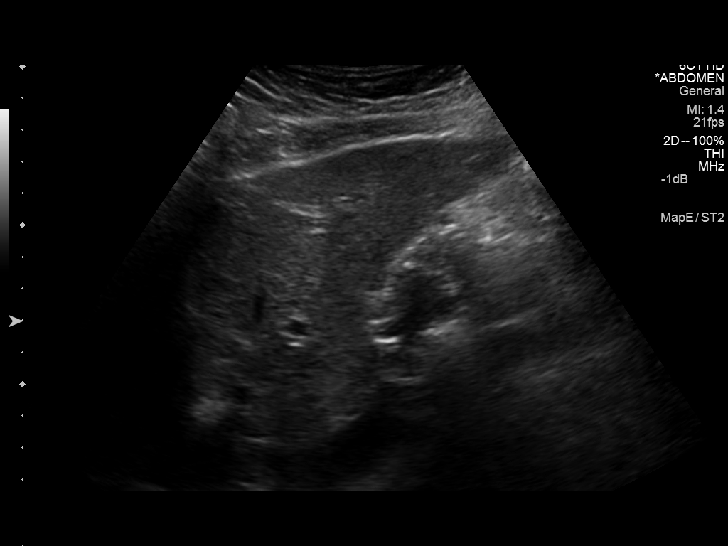
[im 20/80]
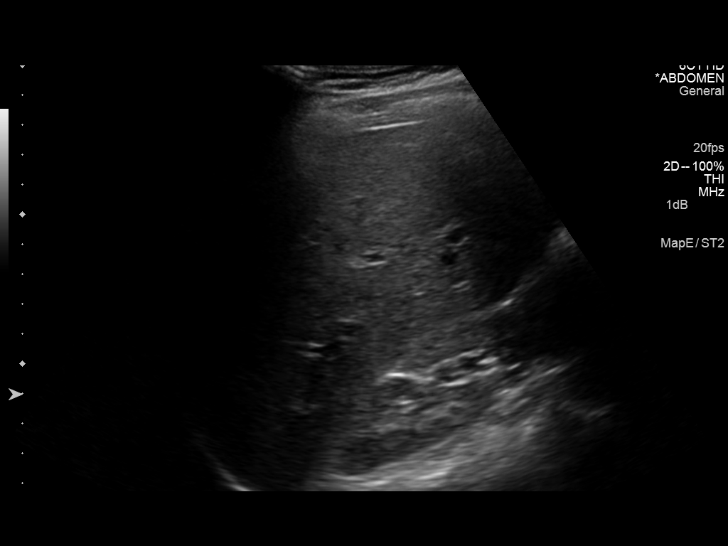
[im 27/80]
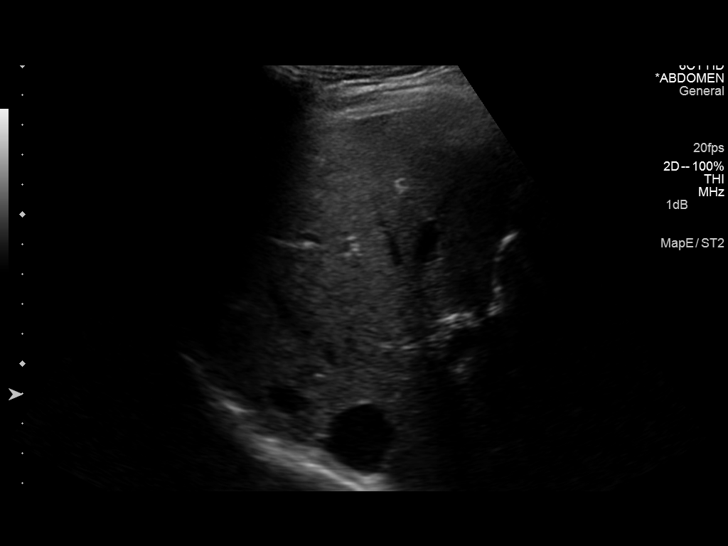
[im 30/80]
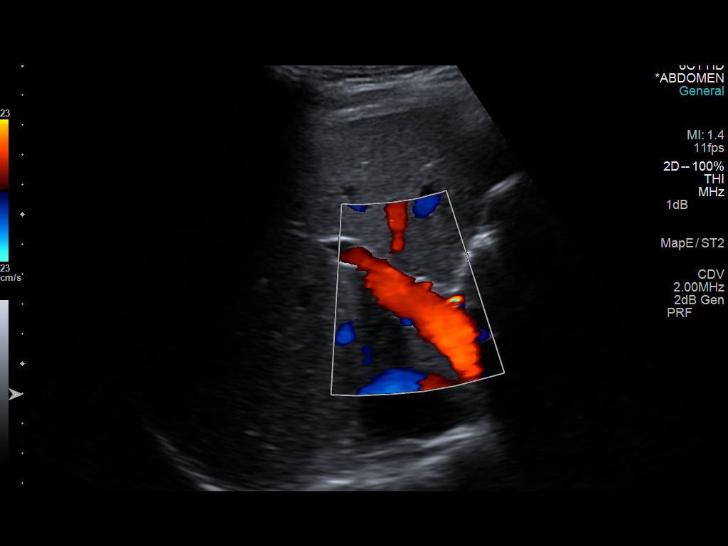
[im 37/80]
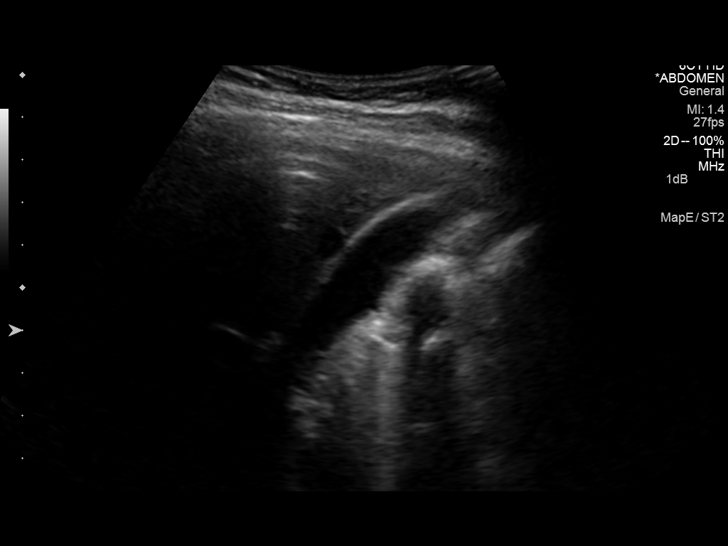
[im 43/80]
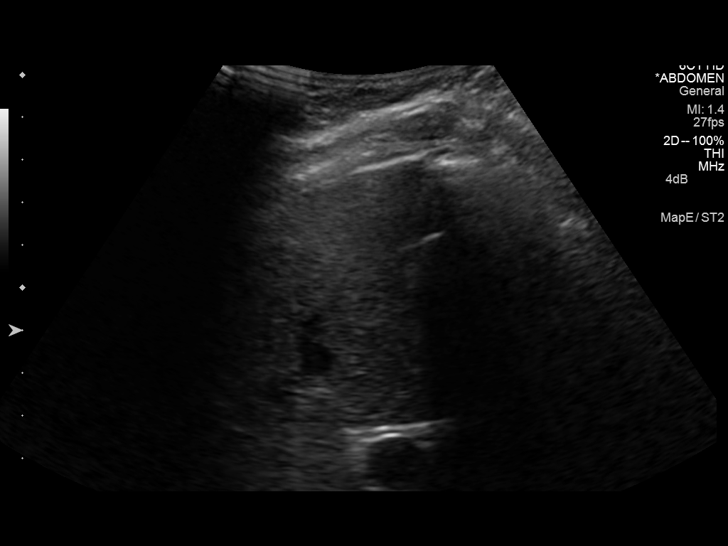
[im 50/80]
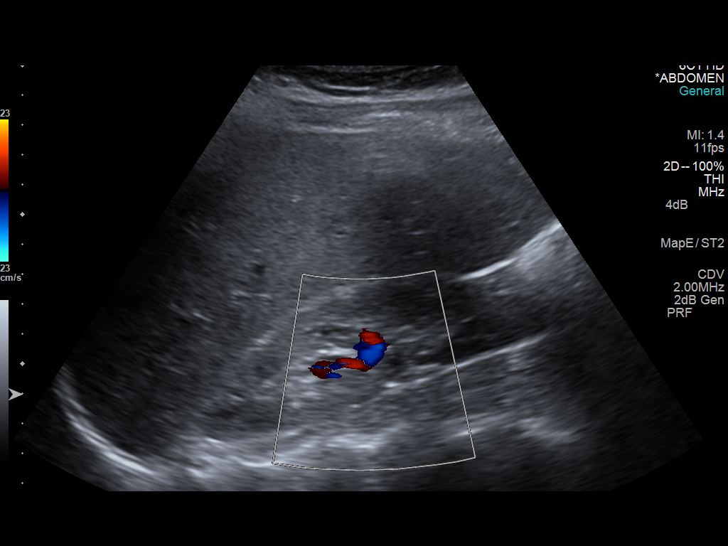
[im 53/80]
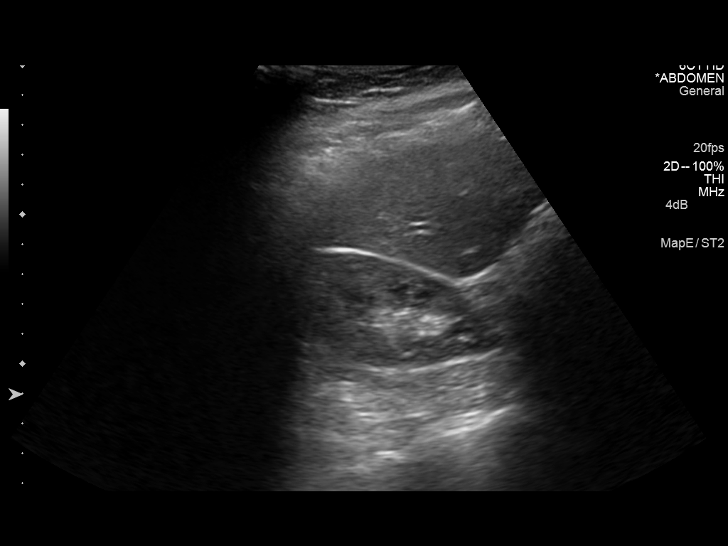
[im 60/80]
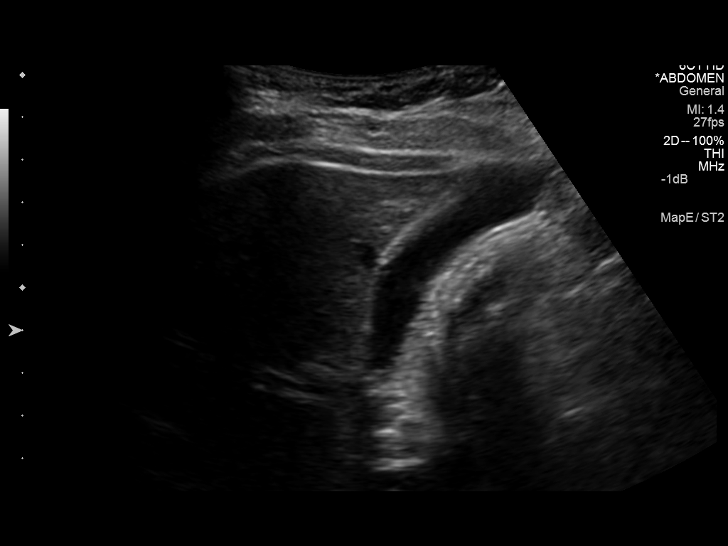
[im 66/80]
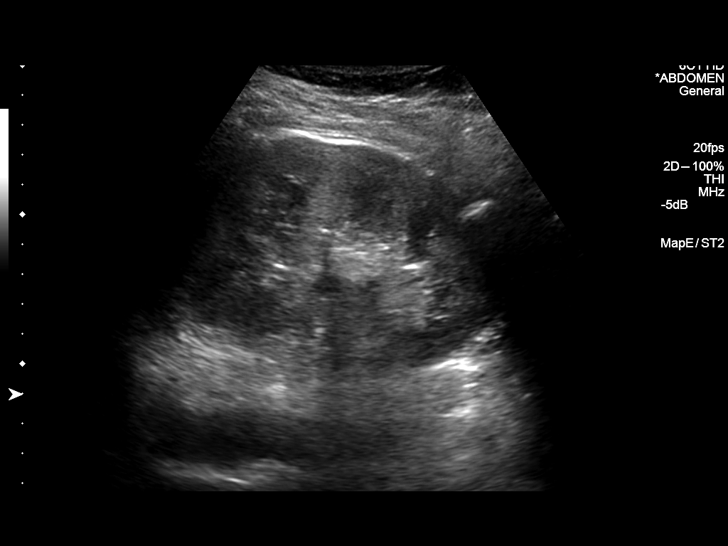
[im 73/80]
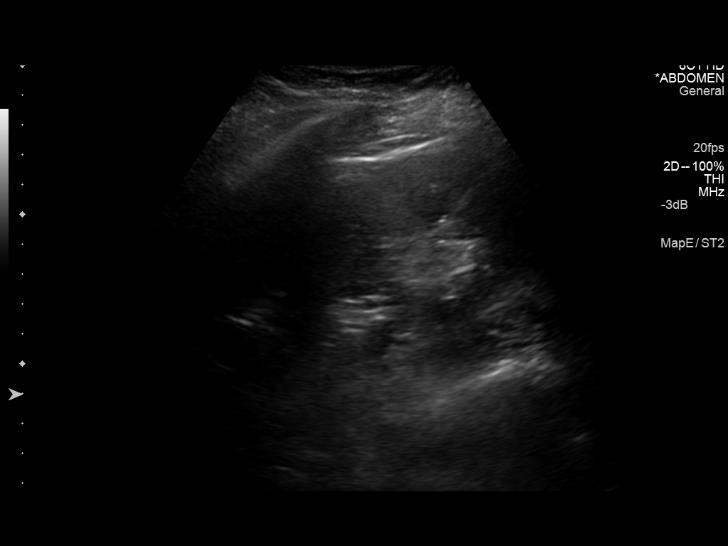
[im 80/80]
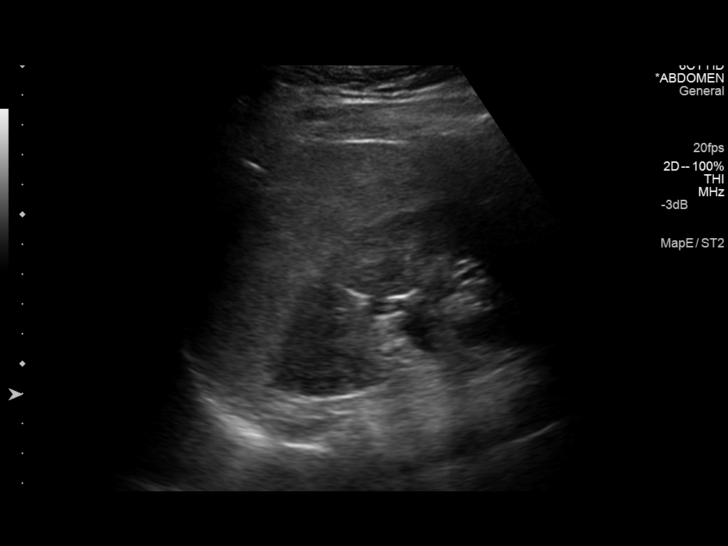

[14 of 25 positions shown; findings below may reference images not displayed]

FINDINGS: Gallbladder:

No gallstones or wall thickening visualized. No sonographic Murphy
sign noted.

Common bile duct:

Diameter: 3 mm

Liver:

No focal lesion identified. Within normal limits in parenchymal
echogenicity.

IVC:

No abnormality visualized.

Pancreas:

Poorly visualized

Spleen:

Size and appearance within normal limits.

Right Kidney:

Length: 12.0 cm. Echogenicity within normal limits. No mass or
hydronephrosis visualized.

Left Kidney:

Length: 12.2 cm. Echogenicity within normal limits. No mass or
hydronephrosis visualized.

Abdominal aorta:

No aneurysm visualized.

Other findings:

None.
IMPRESSION: Unremarkable abdominal ultrasound.

## 2015-12-17 IMAGING — CR DG CHEST 2V
2 series · 2 of 2 positions shown · non-contrast
Comparison: None.

CLINICAL DATA: Cough. Wheezing. Current history of asthma.
Diminished breath sounds on clinical examination.

EXAM:
CHEST  2 VIEW

[view not recorded (1 of 2)]
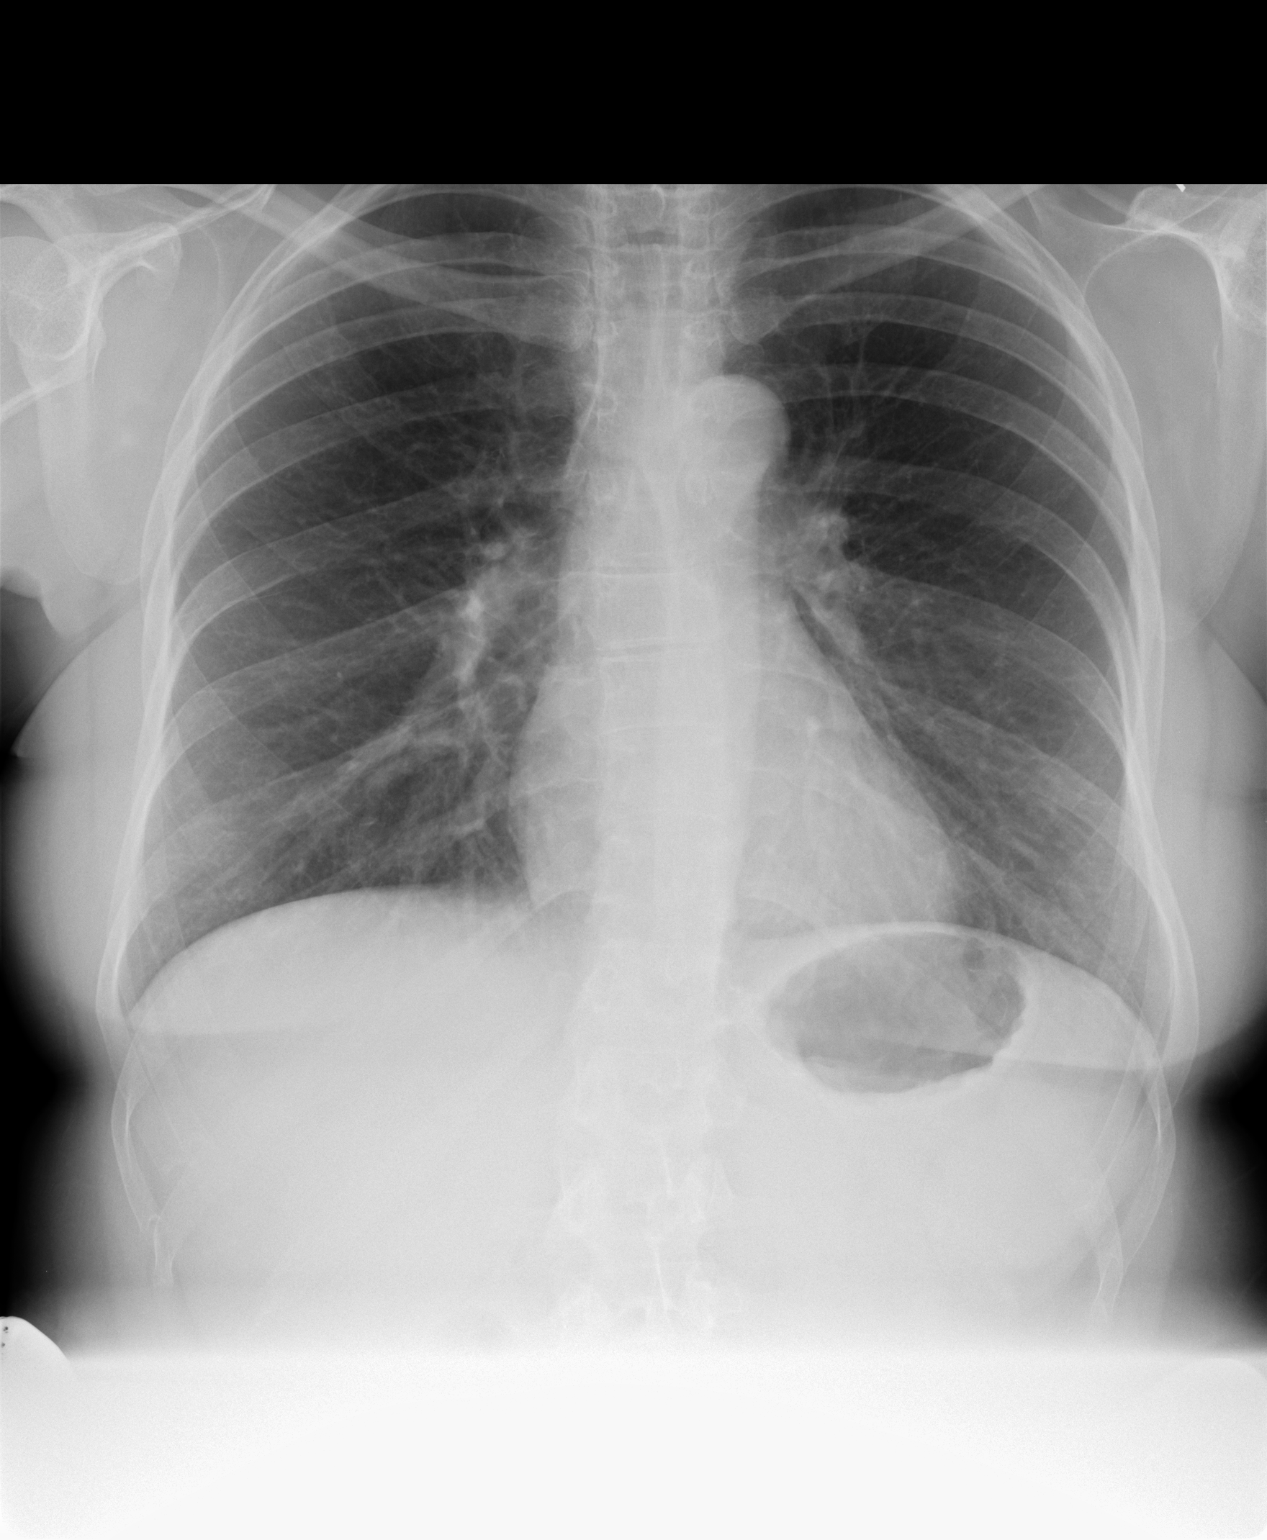

[view not recorded (2 of 2)]
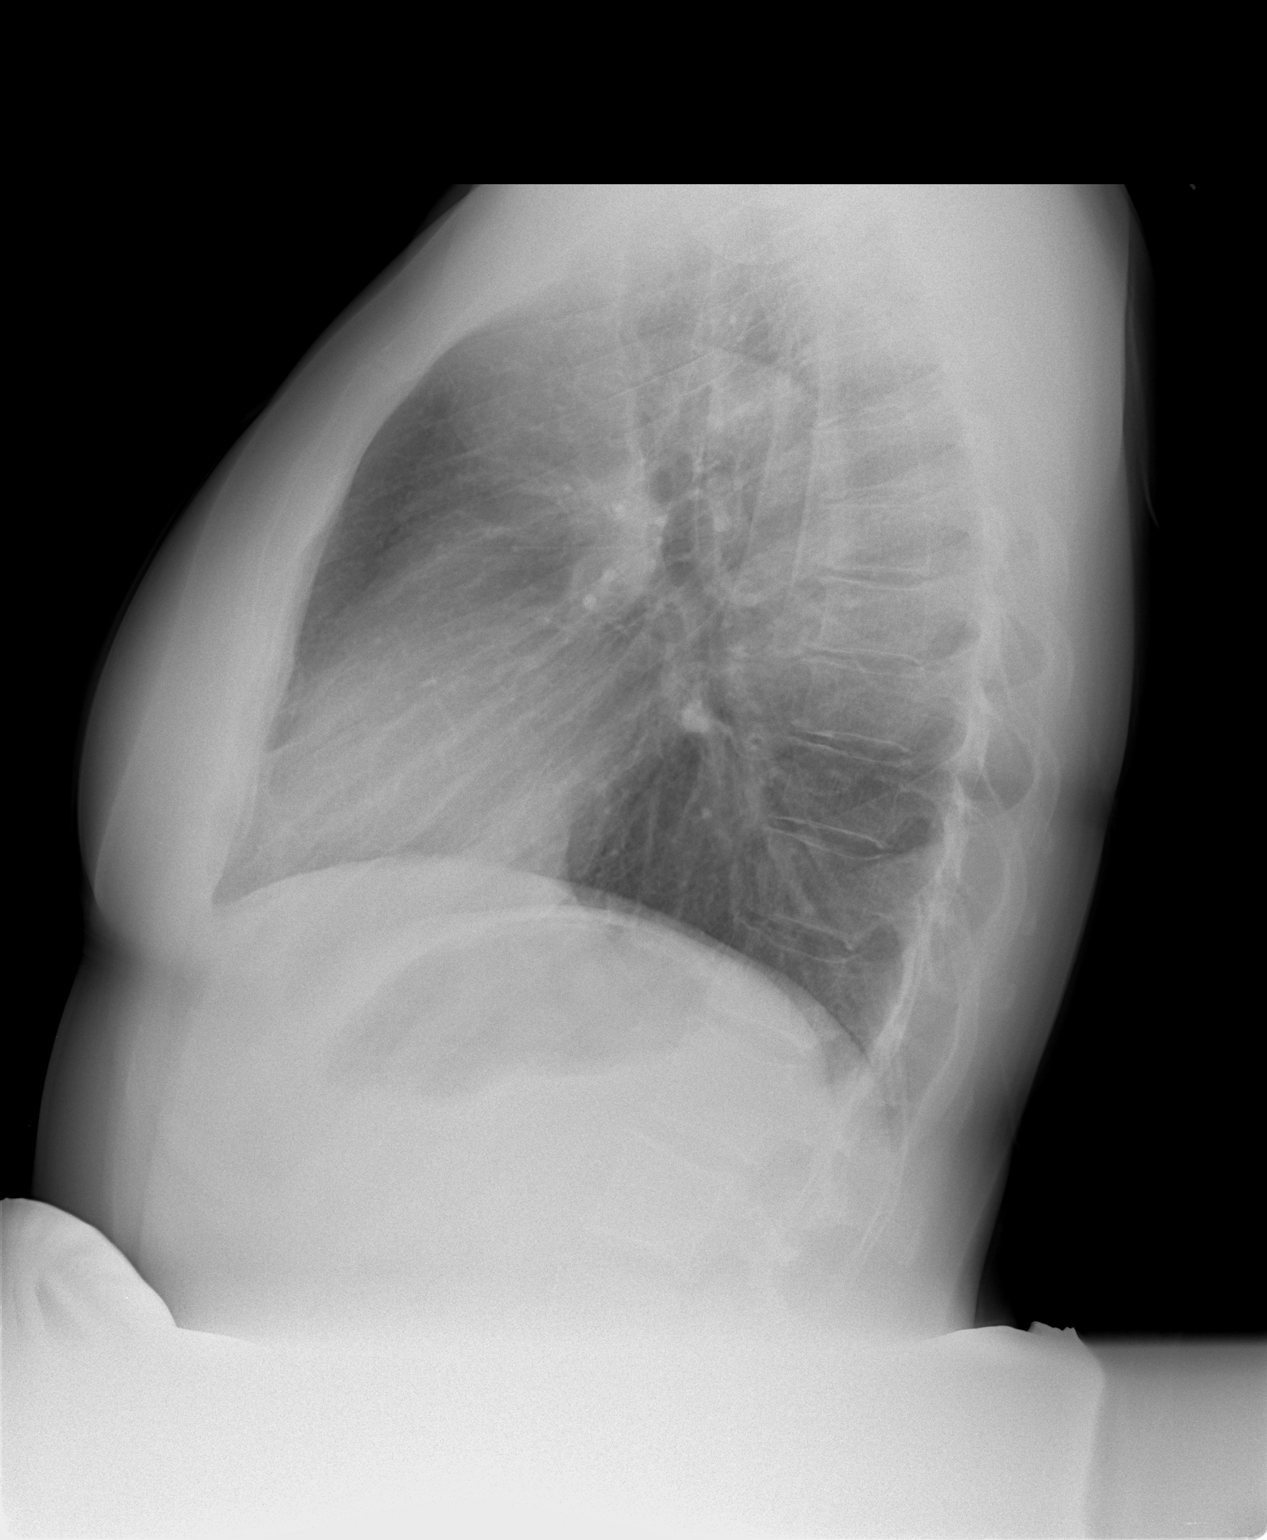

[2 of 2 positions shown; findings below may reference images not displayed]

FINDINGS: Cardiomediastinal silhouette unremarkable. Lungs clear.
Bronchovascular markings normal. Pulmonary vascularity normal. No
visible pleural effusions. No pneumothorax. Minimal degenerative
changes involving the thoracic spine.
IMPRESSION: No acute cardiopulmonary disease.
